# Patient Record
Sex: Female | Born: 1985 | Race: Asian | Hispanic: No | Marital: Married | Smoking: Never smoker
Health system: Southern US, Community
[De-identification: ages and names within clinical notes are randomized; demographics above are authoritative.]

## PROBLEM LIST (undated history)

## (undated) DIAGNOSIS — F329 Major depressive disorder, single episode, unspecified: Secondary | ICD-10-CM

## (undated) DIAGNOSIS — F419 Anxiety disorder, unspecified: Secondary | ICD-10-CM

## (undated) DIAGNOSIS — E039 Hypothyroidism, unspecified: Secondary | ICD-10-CM

## (undated) DIAGNOSIS — F32A Depression, unspecified: Secondary | ICD-10-CM

## (undated) HISTORY — DX: Depression, unspecified: F32.A

## (undated) HISTORY — PX: NO PAST SURGERIES: SHX2092

## (undated) HISTORY — DX: Hypothyroidism, unspecified: E03.9

## (undated) HISTORY — DX: Major depressive disorder, single episode, unspecified: F32.9

## (undated) HISTORY — DX: Anxiety disorder, unspecified: F41.9

---
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
## 2003-02-25 ENCOUNTER — Emergency Department (HOSPITAL_COMMUNITY): Admission: EM | Admit: 2003-02-25 | Discharge: 2003-02-25 | Payer: Self-pay | Admitting: *Deleted

## 2003-04-01 ENCOUNTER — Other Ambulatory Visit: Admission: RE | Admit: 2003-04-01 | Discharge: 2003-04-01 | Payer: Self-pay | Admitting: Gynecology

## 2004-05-04 ENCOUNTER — Other Ambulatory Visit: Admission: RE | Admit: 2004-05-04 | Discharge: 2004-05-04 | Payer: Self-pay | Admitting: Gynecology

## 2005-07-06 ENCOUNTER — Other Ambulatory Visit: Admission: RE | Admit: 2005-07-06 | Discharge: 2005-07-06 | Payer: Self-pay | Admitting: Gynecology

## 2006-07-12 ENCOUNTER — Other Ambulatory Visit: Admission: RE | Admit: 2006-07-12 | Discharge: 2006-07-12 | Payer: Self-pay | Admitting: Gynecology

## 2007-07-15 ENCOUNTER — Other Ambulatory Visit: Admission: RE | Admit: 2007-07-15 | Discharge: 2007-07-15 | Payer: Self-pay | Admitting: Gynecology

## 2008-07-15 ENCOUNTER — Ambulatory Visit: Payer: Self-pay | Admitting: Women's Health

## 2008-07-15 ENCOUNTER — Encounter: Payer: Self-pay | Admitting: Women's Health

## 2008-07-15 ENCOUNTER — Other Ambulatory Visit: Admission: RE | Admit: 2008-07-15 | Discharge: 2008-07-15 | Payer: Self-pay | Admitting: Gynecology

## 2009-07-22 ENCOUNTER — Ambulatory Visit: Payer: Self-pay | Admitting: Women's Health

## 2009-07-22 ENCOUNTER — Other Ambulatory Visit: Admission: RE | Admit: 2009-07-22 | Discharge: 2009-07-22 | Payer: Self-pay | Admitting: Gynecology

## 2010-03-22 ENCOUNTER — Ambulatory Visit (HOSPITAL_COMMUNITY)
Admission: RE | Admit: 2010-03-22 | Discharge: 2010-03-22 | Payer: Self-pay | Source: Home / Self Care | Attending: Obstetrics and Gynecology | Admitting: Obstetrics and Gynecology

## 2010-04-12 ENCOUNTER — Inpatient Hospital Stay (HOSPITAL_COMMUNITY)
Admission: AD | Admit: 2010-04-12 | Discharge: 2010-04-12 | Payer: Self-pay | Source: Home / Self Care | Attending: Obstetrics and Gynecology | Admitting: Obstetrics and Gynecology

## 2010-04-13 LAB — URINE MICROSCOPIC-ADD ON

## 2010-04-13 LAB — URINALYSIS, ROUTINE W REFLEX MICROSCOPIC
Bilirubin Urine: NEGATIVE
Hgb urine dipstick: NEGATIVE
Ketones, ur: >80 mg/dL — AB
Nitrite: NEGATIVE
Protein, ur: NEGATIVE mg/dL
Specific Gravity, Urine: 1.02 (ref 1.005–1.030)
Urine Glucose, Fasting: NEGATIVE mg/dL
Urobilinogen, UA: 0.2 mg/dL (ref 0.0–1.0)
pH: 6.5 (ref 5.0–8.0)

## 2010-04-13 LAB — FETAL FIBRONECTIN: Fetal Fibronectin: NEGATIVE

## 2010-05-17 ENCOUNTER — Inpatient Hospital Stay (HOSPITAL_COMMUNITY)
Admission: AD | Admit: 2010-05-17 | Discharge: 2010-05-19 | DRG: 775 | Disposition: A | Discharge status: Home / Self Care | Payer: PRIVATE HEALTH INSURANCE | Source: Ambulatory Visit | Attending: Obstetrics and Gynecology | Admitting: Obstetrics and Gynecology

## 2010-05-17 LAB — CBC
HCT: 34.5 % — ABNORMAL LOW (ref 36.0–46.0)
Hemoglobin: 12 g/dL (ref 12.0–15.0)
MCH: 29.1 pg (ref 26.0–34.0)
MCHC: 34.8 g/dL (ref 30.0–36.0)
MCV: 83.7 fL (ref 78.0–100.0)
Platelets: 217 10*3/uL (ref 150–400)
RBC: 4.12 MIL/uL (ref 3.87–5.11)
RDW: 14.3 % (ref 11.5–15.5)
WBC: 14.6 10*3/uL — ABNORMAL HIGH (ref 4.0–10.5)

## 2010-05-17 LAB — RPR: RPR Ser Ql: NONREACTIVE

## 2010-05-18 LAB — CBC
HCT: 26.6 % — ABNORMAL LOW (ref 36.0–46.0)
Hemoglobin: 9 g/dL — ABNORMAL LOW (ref 12.0–15.0)
MCH: 28.8 pg (ref 26.0–34.0)
MCHC: 33.8 g/dL (ref 30.0–36.0)
MCV: 85 fL (ref 78.0–100.0)
Platelets: 190 10*3/uL (ref 150–400)
RBC: 3.13 MIL/uL — ABNORMAL LOW (ref 3.87–5.11)
RDW: 14.6 % (ref 11.5–15.5)
WBC: 10.5 10*3/uL (ref 4.0–10.5)

## 2010-05-19 ENCOUNTER — Inpatient Hospital Stay (INDEPENDENT_AMBULATORY_CARE_PROVIDER_SITE_OTHER)
Admission: RE | Admit: 2010-05-19 | Discharge: 2010-05-19 | Disposition: A | Discharge status: Home / Self Care | Payer: PRIVATE HEALTH INSURANCE | Source: Ambulatory Visit

## 2010-05-19 DIAGNOSIS — T50995A Adverse effect of other drugs, medicaments and biological substances, initial encounter: Secondary | ICD-10-CM

## 2010-05-19 DIAGNOSIS — I10 Essential (primary) hypertension: Secondary | ICD-10-CM

## 2010-08-02 ENCOUNTER — Other Ambulatory Visit: Payer: Self-pay | Admitting: Obstetrics and Gynecology

## 2013-12-03 ENCOUNTER — Encounter: Payer: Self-pay | Admitting: Family Medicine

## 2013-12-03 ENCOUNTER — Ambulatory Visit (INDEPENDENT_AMBULATORY_CARE_PROVIDER_SITE_OTHER): Payer: 59 | Admitting: Family Medicine

## 2013-12-03 VITALS — BP 102/80 | HR 108 | Temp 98.2°F | Ht 65.25 in | Wt 230.0 lb

## 2013-12-03 DIAGNOSIS — F329 Major depressive disorder, single episode, unspecified: Secondary | ICD-10-CM

## 2013-12-03 DIAGNOSIS — F32A Depression, unspecified: Secondary | ICD-10-CM

## 2013-12-03 DIAGNOSIS — E669 Obesity, unspecified: Secondary | ICD-10-CM

## 2013-12-03 DIAGNOSIS — Z7689 Persons encountering health services in other specified circumstances: Secondary | ICD-10-CM

## 2013-12-03 DIAGNOSIS — E039 Hypothyroidism, unspecified: Secondary | ICD-10-CM

## 2013-12-03 DIAGNOSIS — F410 Panic disorder [episodic paroxysmal anxiety] without agoraphobia: Secondary | ICD-10-CM

## 2013-12-03 DIAGNOSIS — F3289 Other specified depressive episodes: Secondary | ICD-10-CM

## 2013-12-03 DIAGNOSIS — F411 Generalized anxiety disorder: Secondary | ICD-10-CM

## 2013-12-03 DIAGNOSIS — Z7189 Other specified counseling: Secondary | ICD-10-CM

## 2013-12-03 LAB — T4, FREE: Free T4: 0.72 ng/dL (ref 0.60–1.60)

## 2013-12-03 LAB — TSH: TSH: 2.87 u[IU]/mL (ref 0.35–4.50)

## 2013-12-03 MED ORDER — LORAZEPAM 0.5 MG PO TABS: 0.5000 mg | ORAL_TABLET | Freq: Three times a day (TID) | ORAL | Status: DC | PRN

## 2013-12-03 NOTE — Progress Notes (Signed)
Pre visit review using our clinic review tool, if applicable. No additional management support is needed unless otherwise documented below in the visit note. 

## 2013-12-03 NOTE — Progress Notes (Signed)
No chief complaint on file.   HPI:  Hannah Douglas is here to establish care. Was seeing a family doctor at [place] but insurance Last PCP and physical: seeing Dr. Tenny Craw at [facility] - UTD to day on regular physicals, last pap in 2013. Prefers to do physical here. Has paragard.  Has the following chronic problems and concerns today:  Patient Active Problem List   Diagnosis Date Noted  . GAD (generalized anxiety disorder) 12/03/2013  . Panic disorder 12/03/2013  . Unspecified hypothyroidism 12/03/2013  . 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 Had tried to stop in suddenly and did not do well. -has had a little worsening with stopping the medication, but really does not want to take it -doing yoga and meditation -has xanax - used it a few times in the last few months for panic Symptoms of GAD: Restlessness, on edge: yes Easily Fatigued: no Difficulty concentrating: a little Irritability: increased since weaning the medication Muscle tension:no Sleep Disturbance: yes Chronic, but she prefers not to use medications if possible - but manageable, functioning.  Is getting counseling every 2 week  URI: -started 2 days ago -symptoms: cough, nasal congestion -denies: fevers, SOB, sinus pain -doing tea - knows it is a cold  ROS negative for  unless reported above: fevers, unintentional weight loss, hearing or vision loss, chest pain, palpitations, struggling to breath, hemoptysis, melena, hematochezia, hematuria, falls, loc, si, thoughts of self harm  Past Medical History  Diagnosis Date  . Hypothyroidism   . Anxiety   . Depression     No family history on file.  History   Social History  . Marital Status: Married    Spouse Name: N/A    Number of Children: N/A  . Years of Education: N/A   Social History Main Topics  . Smoking status: Never Smoker   . Smokeless tobacco: None  . Alcohol Use: None  . Drug Use: None  . Sexual Activity: None   Other Topics Concern  . 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 For panic., Disp: 30 tablet, Rfl: 0  EXAM:  Filed Vitals:   12/03/13 1122  BP: 102/80  Pulse: 108  Temp: 98.2 F (36.8 C)    Body mass  index is 38 kg/(m^2).  GENERAL: vitals reviewed and listed above, alert, oriented, appears well hydrated and in no acute distress  HEENT: atraumatic, conjunttiva clear, no obvious abnormalities on inspection of external nose and ears, normal appearance of ear canals and TMs, clear nasal congestion, mild post oropharyngeal erythema with PND, no tonsillar edema or exudate, no sinus TTP  NECK: no obvious masses on inspection  LUNGS: clear to auscultation bilaterally, no wheezes, rales or rhonchi, good air movement  CV: HRRR, no peripheral edema  MS: moves all extremities  without noticeable abnormality  PSYCH: pleasant and cooperative, no obvious depression or anxiety  ASSESSMENT AND PLAN:  Discussed the following assessment and plan:  1. Obesity, unspecified -check TSH, lifestyle recs - increase exercise and healthy regular small meals  2. Unspecified hypothyroidism - TSH - T4, Free -discussed replacement if hypothyroid  3. GAD (generalized anxiety disorder) - for rare panic - DC xanax, use LORazepam (ATIVAN) 0.5 MG tablet; Take 1 tablet (0.5 mg total) by mouth every 8 (eight) hours as needed for anxiety. For panic.  Dispense: 30 tablet; Refill: 0 -risks discussed at length -risk and benefit of SSRI discussed, she prefers to wean off -counseling, medication, yoga, exercise -follow up if any worsening  4. Depression -stable, no depression at this time  5. Panic disorder -prn rare use of ativan  6. Encounter to establish care -We reviewed the PMH, PSH, FH, SH, Meds and Allergies. -We provided refills for any medications we will prescribe as needed. -We addressed current concerns per orders and patient instructions. -We have asked for records for pertinent exams, studies, vaccines and notes from previous providers. -We have advised patient to follow up per instructions below. -follow up for CPE and fasting labs in 3 months  -Patient advised to return or notify a doctor immediately if symptoms worsen or persist or new concerns arise.  Patient Instructions  -go to once per week on the lexapro for a few weeks then stop  -continue counseling, yoga, medication and increase cardiovascular exercise  -switch to ativan for rare panic attack       Kriste Basque R.

## 2013-12-03 NOTE — Patient Instructions (Signed)
-  go to once per week on the lexapro for a few weeks then stop  -continue counseling, yoga, medication and increase cardiovascular exercise  -switch to ativan for rare panic attack

## 2014-03-04 ENCOUNTER — Encounter: Payer: 59 | Admitting: Family Medicine

## 2014-03-05 ENCOUNTER — Ambulatory Visit (INDEPENDENT_AMBULATORY_CARE_PROVIDER_SITE_OTHER): Payer: 59 | Admitting: Family Medicine

## 2014-03-05 ENCOUNTER — Encounter: Payer: Self-pay | Admitting: Family Medicine

## 2014-03-05 VITALS — BP 110/74 | HR 73 | Temp 98.1°F | Ht 62.5 in | Wt 229.0 lb

## 2014-03-05 DIAGNOSIS — J069 Acute upper respiratory infection, unspecified: Secondary | ICD-10-CM

## 2014-03-05 DIAGNOSIS — H9209 Otalgia, unspecified ear: Secondary | ICD-10-CM

## 2014-03-05 MED ORDER — AMOXICILLIN 875 MG PO TABS: 875.0000 mg | ORAL_TABLET | Freq: Two times a day (BID) | ORAL | Status: DC

## 2014-03-05 NOTE — Progress Notes (Signed)
Pre visit review using our clinic review tool, if applicable. No additional management support is needed unless otherwise documented below in the visit note. 

## 2014-03-05 NOTE — Progress Notes (Signed)
HPI:  Ear pain and Upper Resp iInfection: -started: 2 weeks ago -symptoms:nasal congestion, sore throat, cough, sinus pressure, now with R ear pain on and off for about 1 week and pressure in this ear, a little sinus pain - better a little today -denies:fever, SOB, NVD, tooth pain -has tried: ibuprofen, tylenol -sick contacts/travel/risks: denies flu exposure, tick exposure or or Ebola risks, strep expsoure  ROS: See pertinent positives and negatives per HPI.  Past Medical History  Diagnosis Date  . Hypothyroidism   . Anxiety   . Depression     No past surgical history on file.  No family history on file.  History   Social History  . Marital Status: Married    Spouse Name: N/A    Number of Children: N/A  . Years of Education: N/A   Social History Main Topics  . Smoking status: Never Smoker   . Smokeless tobacco: None  . Alcohol Use: None  . Drug Use: None  . Sexual Activity: None   Other Topics Concern  . 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 For panic., Disp: 30 tablet, Rfl: 0;  PARAGARD INTRAUTERINE COPPER IU, by Intrauterine route., Disp: , Rfl: ;  amoxicillin (AMOXIL) 875 MG tablet, Take 1 tablet (875 mg total) by mouth 2 (two) times daily., Disp: 20 tablet, Rfl: 0  EXAM:  Filed Vitals:   03/05/14 1250  BP: 110/74  Pulse: 73  Temp: 98.1 F (36.7 C)    Body mass index is 41.19 kg/(m^2).  GENERAL: vitals reviewed and listed above, alert, oriented, appears well hydrated and in no acute distress  HEENT: atraumatic,  conjunttiva clear, no obvious abnormalities on inspection of external nose and ears, normal appearance of ear canals and TMs, a little pain with manipulation of tragus R, clear nasal congestion, mild post oropharyngeal erythema with PND, no tonsillar edema or exudate, no sinus TTP  NECK: no obvious masses on inspection  LUNGS: clear to auscultation bilaterally, no wheezes, rales or rhonchi, good air movement  CV: HRRR, no peripheral edema  MS: moves all extremities without noticeable abnormality  PSYCH: pleasant and cooperative, no obvious depression or anxiety  ASSESSMENT AND PLAN:  Discussed the following assessment and plan:  Ear pain, unspecified laterality - Plan: amoxicillin (AMOXIL) 875 MG tablet  Acute upper respiratory infection - Plan: amoxicillin (AMOXIL) 875 MG tablet  -given HPI and exam findings today, a serious infection or illness is unlikely. We discussed potential etiologies, with VURI being most likely possible developing sinusitis or otitis.We discussed treatment side effects, likely course, antibiotic misuse, transmission, and signs of developing a serious illness. -opted for delayed abx to start if not improving over ht enext few days or worsens. -of course, we advised to return or notify a doctor immediately if symptoms worsen or persist or new concerns arise.    There are no Patient Instructions on file for this visit.   Kriste BasqueKIM, Hannah Douglas R.

## 2014-05-05 ENCOUNTER — Encounter: Payer: Self-pay | Admitting: Family Medicine

## 2014-05-05 ENCOUNTER — Other Ambulatory Visit (HOSPITAL_COMMUNITY)
Admission: RE | Admit: 2014-05-05 | Discharge: 2014-05-05 | Disposition: A | Discharge status: Home / Self Care | Payer: 59 | Source: Ambulatory Visit | Attending: Family Medicine | Admitting: Family Medicine

## 2014-05-05 ENCOUNTER — Ambulatory Visit (INDEPENDENT_AMBULATORY_CARE_PROVIDER_SITE_OTHER): Payer: 59 | Admitting: Family Medicine

## 2014-05-05 VITALS — BP 110/80 | HR 84 | Temp 97.4°F | Ht 65.5 in | Wt 225.9 lb

## 2014-05-05 DIAGNOSIS — E669 Obesity, unspecified: Secondary | ICD-10-CM

## 2014-05-05 DIAGNOSIS — F411 Generalized anxiety disorder: Secondary | ICD-10-CM

## 2014-05-05 DIAGNOSIS — Z124 Encounter for screening for malignant neoplasm of cervix: Secondary | ICD-10-CM

## 2014-05-05 DIAGNOSIS — Z0000 Encounter for general adult medical examination without abnormal findings: Secondary | ICD-10-CM

## 2014-05-05 DIAGNOSIS — Z01419 Encounter for gynecological examination (general) (routine) without abnormal findings: Secondary | ICD-10-CM | POA: Diagnosis not present

## 2014-05-05 DIAGNOSIS — Z1151 Encounter for screening for human papillomavirus (HPV): Secondary | ICD-10-CM | POA: Insufficient documentation

## 2014-05-05 DIAGNOSIS — F410 Panic disorder [episodic paroxysmal anxiety] without agoraphobia: Secondary | ICD-10-CM

## 2014-05-05 LAB — LIPID PANEL
Cholesterol: 153 mg/dL (ref 0–200)
HDL: 37.5 mg/dL — ABNORMAL LOW (ref >39.00)
LDL Cholesterol: 80 mg/dL (ref 0–99)
NonHDL: 115.5
Total CHOL/HDL Ratio: 4
Triglycerides: 180 mg/dL — ABNORMAL HIGH (ref 0.0–149.0)
VLDL: 36 mg/dL (ref 0.0–40.0)

## 2014-05-05 LAB — HM PAP SMEAR: HM Pap smear: 2092016

## 2014-05-05 LAB — HEMOGLOBIN A1C: Hgb A1c MFr Bld: 5.1 % (ref 4.6–6.5)

## 2014-05-05 NOTE — Addendum Note (Signed)
Addended by: Johnella MoloneyFUNDERBURK, Saivon Prowse A on: 05/05/2014 10:04 AM   Modules accepted: Orders

## 2014-05-05 NOTE — Progress Notes (Signed)
HPI:  Here for CPE:  -Concerns and/or follow up today:   Hx mild hypothyroidism: -intermittently on synthroid in the past but self weaned -denies: excessive fatigue ,weight gain, skin changes, hot/cold intol, constipation  Hx GAD and mild panic: -on lexapro in the past -uses prn benzo rarely - had changed her from xanax to ativan -reports: seeing Dr. Evelene Croon in psychiatry - now back on xanax for panic attacks, back on lexapro  -denies: SI, thoughts of self, depression  -Diet: variety of foods, balance and well rounded  -Exercise: regular exercise - has really increased  -Taking folic acid, vitamin D or calcium: takes fish oil and multivit  -Diabetes and Dyslipidemia Screening:  -Hx of HTN: no  -Vaccines: flu vaccine - had this  -pap history: 2012 - reports all normal paps in the past  -FDLMP: 1 week ago; has paragard - reports inserted in 2012, no trouble, doing well; periods are normal, regula  -sexual activity: yes, female partner, no new partners  -wants STI testing: no  -Alcohol, Tobacco, drug use: see social history  Review of Systems - no fevers, unintentional weight loss, vision loss, hearing loss, chest pain, sob, hemoptysis, melena, hematochezia, hematuria, genital discharge, changing or concerning skin lesions, bleeding, bruising, loc, thoughts of self harm or SI  Past Medical History  Diagnosis Date  . Hypothyroidism   . Anxiety   . Depression     No past surgical history on file.  No family history on file.  History   Social History  . Marital Status: Married    Spouse Name: N/A    Number of Children: N/A  . Years of Education: N/A   Social History Main Topics  . Smoking status: Never Smoker   . Smokeless tobacco: None  . Alcohol Use: None  . Drug Use: None  . Sexual Activity: None   Other Topics Concern  . 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  escitalopram (LEXAPRO) 20 MG tablet, Take 20 mg by mouth daily., Disp: , Rfl:  .  PARAGARD INTRAUTERINE COPPER IU, by Intrauterine route., Disp: , Rfl:   EXAM:  Filed Vitals:   05/05/14 0941  BP: 110/80  Pulse: 84  Temp: 97.4 F (36.3 C)    GENERAL: vitals reviewed and listed below, alert, oriented, appears well hydrated and in no acute distress  HEENT: head atraumatic, PERRLA, normal appearance of eyes, ears, nose and mouth. moist mucus membranes.  NECK: supple, no masses or lymphadenopathy  LUNGS: clear to auscultation bilaterally, no rales, rhonchi or wheeze  CV: HRRR, no peripheral edema or cyanosis, normal pedal pulses  BREAST: normal appearance - no lesions or discharge, on palpation normal breast tissue without any suspicious masses  ABDOMEN: bowel sounds normal, soft, non tender to palpation, no masses, no rebound or guarding  GU: normal appearance of external genitalia - no lesions or masses, normal vaginal mucosa - no abnormal discharge, normal appearance of cervix - no lesions or abnormal discharge,  no masses or tenderness on palpation of uterus and ovaries. paragard strings seen from os  RECTAL: refused  SKIN: no rash or abnormal lesions  MS: normal gait, moves all extremities normally  NEURO: CN II-XII grossly intact, normal muscle strength and sensation to light touch on extremities  PSYCH: normal affect, pleasant and cooperative  ASSESSMENT AND PLAN:  Discussed the following assessment and plan:  Visit for preventive health examination - Plan: Lipid Panel, Hemoglobin A1c  Obesity - Plan: Lipid Panel, Hemoglobin A1c  GAD (generalized anxiety disorder)  Panic  disorder  -Discussed and advised all [place] preventive services health task force level A and B recommendations for age, sex and risks.  -Advised at least 150 minutes of exercise per week and a healthy diet low in saturated fats and sweets and consisting of fresh fruits and vegetables, lean meats such as fish and white chicken and whole grains.  -labs, studies and vaccines per orders this encounter  Orders Placed This Encounter  Procedures  . Lipid Panel  . Hemoglobin A1c    Patient advised to return to clinic immediately if symptoms worsen or persist or new concerns.  Patient Instructions  BEFORE YOU LEAVE: -labs -follow up in 6 months  -VD3 [phone] IU daily and adequate calcium (1200mg  ) in diet  -We have ordered labs or studies at this visit. It can take up to 1-2 weeks for results and processing. We will contact you with instructions IF your results are abnormal. Normal results will be released to your [facility]. If you have not heard from us or can not find your results in [facility] in 2 weeks please contact our office.  -PLEASE SIGN UP FOR MYCHART TODAY   We recommend the following healthy lifestyle measures: - eat a healthy diet consisting of lots of vegetables, fruits, beans, nuts, seeds, healthy meats such as white chicken and fish and whole grains.  - avoid fried foods, fast food, processed foods, sodas, red meet and other fattening foods.  - get a least 150 minutes of aerobic exercise per week.       No Follow-up on file.  Kriste BasqueKIM, HANNAH R.

## 2014-05-05 NOTE — Progress Notes (Signed)
Pre visit review using our clinic review tool, if applicable. No additional management support is needed unless otherwise documented below in the visit note. 

## 2014-05-05 NOTE — Patient Instructions (Signed)
BEFORE YOU LEAVE: -labs -follow up in 6 months  -VD3 [phone] IU daily and adequate calcium (1200mg  ) in diet  -We have ordered labs or studies at this visit. It can take up to 1-2 weeks for results and processing. We will contact you with instructions IF your results are abnormal. Normal results will be released to your [facility]. If you have not heard from us or can not find your results in [facility] in 2 weeks please contact our office.  -PLEASE SIGN UP FOR MYCHART TODAY   We recommend the following healthy lifestyle measures: - eat a healthy diet consisting of lots of vegetables, fruits, beans, nuts, seeds, healthy meats such as white chicken and fish and whole grains.  - avoid fried foods, fast food, processed foods, sodas, red meet and other fattening foods.  - get a least 150 minutes of aerobic exercise per week.

## 2014-05-07 LAB — CYTOLOGY - PAP

## 2015-01-21 ENCOUNTER — Telehealth: Payer: Self-pay | Admitting: Family Medicine

## 2015-01-21 NOTE — Telephone Encounter (Signed)
I called the pt and informed her of the message below and scheduled an appt for tomorrow at 10:30am.

## 2015-01-21 NOTE — Telephone Encounter (Signed)
Left a message for the pt to return my call.  

## 2015-01-21 NOTE — Telephone Encounter (Signed)
Happy to refill if can see her to discuss as have not seen her in while. Can you help her get appt today or tomorrow? Thanks.

## 2015-01-21 NOTE — Telephone Encounter (Signed)
[facility] Dr [facility]velene CroonKaur. Pt states it will cost her a lot to see him. Pt was going to get her 6 mo meds refilled this week when she fopund out this information.  escitalopram (LEXAPRO) 20 MG tablet  1/day wellbutrin 300 XR 1/day  Pt wants to know if Dr Selena BattenKim will refill until she finds a psychiatrist Pt has 1 (one) each left  [facility]

## 2015-01-22 ENCOUNTER — Encounter: Payer: Self-pay | Admitting: Family Medicine

## 2015-01-22 ENCOUNTER — Ambulatory Visit (INDEPENDENT_AMBULATORY_CARE_PROVIDER_SITE_OTHER): Payer: 59 | Admitting: Family Medicine

## 2015-01-22 VITALS — BP 108/80 | HR 94 | Temp 98.5°F | Ht 65.5 in | Wt 228.1 lb

## 2015-01-22 DIAGNOSIS — E669 Obesity, unspecified: Secondary | ICD-10-CM

## 2015-01-22 DIAGNOSIS — F410 Panic disorder [episodic paroxysmal anxiety] without agoraphobia: Secondary | ICD-10-CM

## 2015-01-22 DIAGNOSIS — F3342 Major depressive disorder, recurrent, in full remission: Secondary | ICD-10-CM

## 2015-01-22 DIAGNOSIS — E039 Hypothyroidism, unspecified: Secondary | ICD-10-CM | POA: Diagnosis not present

## 2015-01-22 DIAGNOSIS — F411 Generalized anxiety disorder: Secondary | ICD-10-CM

## 2015-01-22 LAB — TSH: TSH: 4.37 u[IU]/mL (ref 0.35–4.50)

## 2015-01-22 MED ORDER — BUPROPION HCL ER (XL) 300 MG PO TB24: 300.0000 mg | ORAL_TABLET | Freq: Every day | ORAL | Status: DC

## 2015-01-22 MED ORDER — ESCITALOPRAM OXALATE 20 MG PO TABS: 20.0000 mg | ORAL_TABLET | Freq: Every day | ORAL | Status: DC

## 2015-01-22 NOTE — Progress Notes (Signed)
Pre visit review using our clinic review tool, if applicable. No additional management support is needed unless otherwise documented below in the visit note. 

## 2015-01-22 NOTE — Progress Notes (Signed)
HPI:  Hx mild hypothyroidism: -intermittently on synthroid in the past but self weaned -denies: excessive fatigue ,weight gain, skin changes, hot/cold intol, constipation  Hx MDD mild, GAD and mild panic: -used to see Dr. Evelene CroonKaur and was on benozs and lexapro -reports Dr. Evelene CroonKaur no longer in network -current meds: lexapro 20 and wellbutrin XR 300, xanax 0.5mg  rarely (1-2x per month) for panic -reports doing much better -regular CBT with Dyke MaesKen Brown -reports regular exercise -denies: SI, thoughts of self, depression, frequent anxiety or panic -has paragard  ROS: See pertinent positives and negatives per HPI.  Past Medical History  Diagnosis Date  . Hypothyroidism   . Anxiety   . Depression     No past surgical history on file.  No family history on file.  Social History   Social History  . Marital Status: Married    Spouse Name: N/A  . Number of Children: N/A  . Years of Education: N/A   Social History Main Topics  . Smoking status: Never Smoker   . Smokeless tobacco: None  . Alcohol Use: None  . Drug Use: None  . Sexual Activity: Not Asked   Other Topics Concern  . 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  buPROPion (WELLBUTRIN XL) 300 MG 24 hr tablet, Take 1 tablet (300 mg total) by mouth daily., Disp: 30 tablet, Rfl: 3 .  escitalopram (LEXAPRO) 20 MG tablet, Take 1 tablet (20 mg total) by mouth daily., Disp: 30 tablet, Rfl: 3 .  PARAGARD INTRAUTERINE COPPER IU, by Intrauterine route., Disp: , Rfl:  .  ALPRAZolam (XANAX) 0.5 MG tablet, Take 1 tablet (0.5 mg total) by mouth 2 (two) times daily  as needed for anxiety (panic attack - uses 1-2 times per month)., Disp: , Rfl: 0  EXAM:  Filed Vitals:   01/22/15 1034  BP: 108/80  Pulse: 94  Temp: 98.5 F (36.9 C)    Body mass index is 37.37 kg/(m^2).  GENERAL: vitals reviewed and listed above, alert, oriented, appears well hydrated and in no acute distress  HEENT: atraumatic, conjunttiva clear, no obvious abnormalities on inspection of external nose and ears  NECK: no obvious masses on inspection  LUNGS: clear to auscultation bilaterally, no wheezes, rales or rhonchi, good air movement  CV: HRRR, no peripheral edema  MS: moves all extremities without noticeable abnormality  PSYCH: pleasant and cooperative, no obvious depression or anxiety  ASSESSMENT AND PLAN:  Discussed the following assessment and plan:  Recurrent major depressive disorder, in full remission (HCC)  GAD (generalized anxiety disorder)  Panic disorder  Hypothyroidism, unspecified hypothyroidism type - Plan: TSH  Obesity, unspecified  -doing well, meds refilled and she prefers to see me moving forward for this -she plans to cont CBT with Dyke MaesKen Brown -lifestyle recs -TSH check -follow up 3 months -Patient advised to return or notify a doctor immediately if symptoms worsen or persist or new concerns arise.  Patient Instructions  BEFORE YOU LEAVE: -labs -follow up  appointment in 3 months  We recommend the following healthy lifestyle measures: - eat a healthy whole foods diet consisting of regular small meals composed of vegetables, fruits, beans, nuts, seeds, healthy meats such as white chicken and fish and whole grains.  - avoid sweets, white starchy foods, fried foods, fast food, processed foods, sodas, red meet and other fattening foods.  - get a least 150-300 minutes of aerobic exercise per week.   -We have ordered labs or studies at this visit. It can take up to 1-2 weeks for results and processing. We will contact you with instructions IF  your results are abnormal. Normal results will be released to your [facility] If you have not heard from [place] or can not find your results in [facility] in 2 weeks please contact our office.            Kriste Basque R.

## 2015-01-22 NOTE — Patient Instructions (Signed)
BEFORE YOU LEAVE: -labs -follow up appointment in 3 months  We recommend the following healthy lifestyle measures: - eat a healthy whole foods diet consisting of regular small meals composed of vegetables, fruits, beans, nuts, seeds, healthy meats such as white chicken and fish and whole grains.  - avoid sweets, white starchy foods, fried foods, fast food, processed foods, sodas, red meet and other fattening foods.  - get a least 150-300 minutes of aerobic exercise per week.   -We have ordered labs or studies at this visit. It can take up to 1-2 weeks for results and processing. We will contact you with instructions IF your results are abnormal. Normal results will be released to your [facility]. If you have not heard from us or can not find your results in [facility] in 2 weeks please contact our office.

## 2015-03-30 DIAGNOSIS — H52223 Regular astigmatism, bilateral: Secondary | ICD-10-CM | POA: Diagnosis not present

## 2015-04-02 MED FILL — ESCITALOPRAM 20 MG TABLET: 20 | 30 days supply | Qty: 30 | Fill #1

## 2015-04-02 MED FILL — BUPROPION HCL XL 300 MG TAB: 300 | 30 days supply | Qty: 30 | Fill #1

## 2015-04-09 MED FILL — ALPRAZolam ER 1 MG TB24: 1 | 90 days supply | Qty: 90 | Fill #0

## 2015-04-09 MED FILL — ALPRAZolam 0.5 MG TABS: 0.5 | 90 days supply | Qty: 180 | Fill #0

## 2015-04-20 ENCOUNTER — Ambulatory Visit (INDEPENDENT_AMBULATORY_CARE_PROVIDER_SITE_OTHER): Payer: Self-pay | Admitting: Family Medicine

## 2015-04-20 DIAGNOSIS — R69 Illness, unspecified: Secondary | ICD-10-CM

## 2015-04-20 NOTE — Progress Notes (Signed)
NO SHOW  On [facility] prior to appt:  Hx mild hypothyroidism: -intermittently on synthroid in the past but self weaned   Hx MDD mild, GAD and mild panic: -used to see Dr. Evelene Croon and was on benozs and lexapro -reported Dr. Evelene Croon no longer in network -current meds: lexapro 20 and wellbutrin XR 300, xanax 0.5mg  rarely (1-2x per month) for panic -regular CBT with Dyke Maes -has paragard

## 2015-04-29 MED FILL — ESCITALOPRAM 20 MG TABLET: 20 | 30 days supply | Qty: 30 | Fill #2

## 2015-05-17 MED FILL — ESCITALOPRAM 20 MG TABLET: 20 | 90 days supply | Qty: 180 | Fill #0

## 2015-05-28 MED FILL — BUPROPION HCL XL 300 MG TAB: 300 | 30 days supply | Qty: 30 | Fill #2

## 2015-06-23 ENCOUNTER — Telehealth: Payer: 59 | Admitting: Family

## 2015-06-23 DIAGNOSIS — N760 Acute vaginitis: Secondary | ICD-10-CM

## 2015-06-23 DIAGNOSIS — A499 Bacterial infection, unspecified: Secondary | ICD-10-CM | POA: Diagnosis not present

## 2015-06-23 DIAGNOSIS — B9689 Other specified bacterial agents as the cause of diseases classified elsewhere: Secondary | ICD-10-CM

## 2015-06-23 MED ORDER — METRONIDAZOLE 500 MG PO TABS: 500.0000 mg | ORAL_TABLET | Freq: Two times a day (BID) | ORAL | Status: DC

## 2015-06-23 NOTE — Progress Notes (Signed)

## 2015-07-07 MED FILL — BUPROPION HCL XL 300 MG TAB: 300 | 90 days supply | Qty: 90 | Fill #0

## 2015-09-14 MED FILL — ESCITALOPRAM 20 MG TABLET: 20 | 90 days supply | Qty: 180 | Fill #1

## 2015-10-07 ENCOUNTER — Encounter: Payer: Self-pay | Admitting: Family Medicine

## 2015-10-07 ENCOUNTER — Ambulatory Visit (INDEPENDENT_AMBULATORY_CARE_PROVIDER_SITE_OTHER): Payer: 59 | Admitting: Family Medicine

## 2015-10-07 VITALS — BP 120/88 | HR 90 | Temp 98.4°F | Ht 65.5 in | Wt 230.4 lb

## 2015-10-07 DIAGNOSIS — J069 Acute upper respiratory infection, unspecified: Secondary | ICD-10-CM | POA: Diagnosis not present

## 2015-10-07 DIAGNOSIS — J029 Acute pharyngitis, unspecified: Secondary | ICD-10-CM | POA: Diagnosis not present

## 2015-10-07 LAB — POCT RAPID STREP A (OFFICE): Rapid Strep A Screen: NEGATIVE

## 2015-10-07 NOTE — Addendum Note (Signed)
Addended by: Johnella MoloneyFUNDERBURK, Karrah Mangini A on: 10/07/2015 02:44 PM   Modules accepted: Orders

## 2015-10-07 NOTE — Progress Notes (Signed)
HPI:  Hannah Douglas is here for an acute visit for a sore throat. She reports she has had a cold that started last week with drainage, hacking cough and congestion. The sore throat has been more the last few days. She is around a lot of kids with her work. Denies fevers, shortness of breath, wheezing, nausea, vomiting, diarrhea, headache, sinus pain. Denies strep or flu exposure that she knows of.  ROS: See pertinent positives and negatives per HPI.  Past Medical History  Diagnosis Date  . Hypothyroidism   . Anxiety   . Depression     No past surgical history on file.  No family history on file.  Social History   Social History  . Marital Status: Married    Spouse Name: N/A  . Number of Children: N/A  . Years of Education: N/A   Social History Main Topics  . Smoking status: Never Smoker   . Smokeless tobacco: None  . Alcohol Use: None  . Drug Use: None  . Sexual Activity: Not Asked   Other Topics Concern  . 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  buPROPion (WELLBUTRIN XL) 300 MG 24 hr tablet, Take 1 tablet (300 mg total) by mouth daily., Disp: 30 tablet, Rfl: 3 .  escitalopram (LEXAPRO) 20 MG tablet, Take 1 tablet (20 mg total) by mouth daily., Disp: 30 tablet, Rfl: 3 .  PARAGARD INTRAUTERINE COPPER IU, by Intrauterine route., Disp: , Rfl:   EXAM:  Filed Vitals:   10/07/15 1358  BP: 120/88  Pulse: 90  Temp: 98.4 F (36.9 C)    Body mass index is 37.74 kg/(m^2).  GENERAL: vitals reviewed and listed above, alert, oriented, appears well hydrated and in no acute distress  HEENT: atraumatic,  conjunttiva clear, no obvious abnormalities on inspection of external nose and ears, normal appearance of ear canals and TMs, clear nasal congestion, mild post oropharyngeal erythema with PND, 1+ tonsillar edema, no exudate, no sinus TTP  NECK: no obvious masses on inspection  LUNGS: clear to auscultation bilaterally, no wheezes, rales or rhonchi, good air movement  CV: HRRR, no peripheral edema  MS: moves all extremities without noticeable abnormality  PSYCH: pleasant and cooperative, no obvious depression or anxiety  ASSESSMENT AND PLAN:  Discussed the following assessment and plan:  Sore throat  Viral upper respiratory illness  - rapid strep and culture per her concerns -Most likely viral, opted for supportive measures and return to clinic if symptoms worsen or persist -Patient advised to return or notify a doctor immediately if symptoms worsen or persist or new concerns arise.  Patient Instructions  INSTRUCTIONS FOR UPPER RESPIRATORY INFECTION:  -plenty of rest and fluids  -nasal saline wash 2-3 times daily (use prepackaged nasal saline or bottled/distilled water if making your own)   -can use AFRIN nasal spray for drainage and nasal congestion - but do NOT use longer then 3-4 days  -can use tylenol (in no history of liver disease) or ibuprofen (  if no history of kidney disease, bowel bleeding or significant heart disease) as directed for aches and sorethroat  -in the winter time, using a humidifier at night is helpful (please follow cleaning instructions)  -if you are taking a cough medication - use only as directed, may also try a teaspoon of honey to coat the throat and throat lozenges.  -for sore throat, salt water gargles can help  -follow up if you have fevers, facial pain, tooth pain, difficulty breathing or are worsening or symptoms persist longer then expected  Upper Respiratory Infection, Adult An upper respiratory infection (URI) is also known as the common  cold. It is often caused by a type of germ (virus). Colds are easily spread (contagious). You can pass it to others by kissing, coughing, sneezing, or drinking out of the same glass. Usually, you get better in 1 to 3  weeks.  However, the cough can last for even longer. HOME CARE   Only take medicine as told by your doctor. Follow instructions provided above.  Drink enough water and fluids to keep your pee (urine) clear or pale yellow.  Get plenty of rest.  Return to work when your temperature is < 100 for 24 hours or as told by your doctor. You may use a face mask and wash your hands to stop your cold from spreading. GET HELP RIGHT AWAY IF:   After the first few days, you feel you are getting worse.  You have questions about your medicine.  You have chills, shortness of breath, or red spit (mucus).  You have pain in the face for more then 1-2 days, especially when you bend forward.  You have a fever, puffy (swollen) neck, pain when you swallow, or white spots in the back of your throat.  You have a bad headache, ear pain, sinus pain, or chest pain.  You have a high-pitched whistling sound when you breathe in and out (wheezing).  You cough up blood.  You have sore muscles or a stiff neck. MAKE SURE YOU:   Understand these instructions.  Will watch your condition.  Will get help right away if you are not doing well or get worse. Document Released: 08/30/2007 Document Revised: 06/05/2011 Document Reviewed: 06/18/2013 [facility] Patient Information 2015 [place], [place]. This information is not intended to replace advice given to you by your health care provider. Make sure you discuss any questions you have with your health care provider.     Kriste Basque R., DO

## 2015-10-07 NOTE — Progress Notes (Signed)
Pre visit review using our clinic review tool, if applicable. No additional management support is needed unless otherwise documented below in the visit note. 

## 2015-10-07 NOTE — Patient Instructions (Signed)
INSTRUCTIONS FOR UPPER RESPIRATORY INFECTION:  -plenty of rest and fluids  -nasal saline wash 2-3 times daily (use prepackaged nasal saline or bottled/distilled water if making your own)   -can use AFRIN nasal spray for drainage and nasal congestion - but do NOT use longer then 3-4 days  -can use tylenol (in no history of liver disease) or ibuprofen (if no history of kidney disease, bowel bleeding or significant heart disease) as directed for aches and sorethroat  -in the winter time, using a humidifier at night is helpful (please follow cleaning instructions)  -if you are taking a cough medication - use only as directed, may also try a teaspoon of honey to coat the throat and throat lozenges.  -for sore throat, salt water gargles can help  -follow up if you have fevers, facial pain, tooth pain, difficulty breathing or are worsening or symptoms persist longer then expected  Upper Respiratory Infection, Adult An upper respiratory infection (URI) is also known as the common cold. It is often caused by a type of germ (virus). Colds are easily spread (contagious). You can pass it to others by kissing, coughing, sneezing, or drinking out of the same glass. Usually, you get better in 1 to 3  weeks.  However, the cough can last for even longer. HOME CARE   Only take medicine as told by your doctor. Follow instructions provided above.  Drink enough water and fluids to keep your pee (urine) clear or pale yellow.  Get plenty of rest.  Return to work when your temperature is < 100 for 24 hours or as told by your doctor. You may use a face mask and wash your hands to stop your cold from spreading. GET HELP RIGHT AWAY IF:   After the first few days, you feel you are getting worse.  You have questions about your medicine.  You have chills, shortness of breath, or red spit (mucus).  You have pain in the face for more then 1-2 days, especially when you bend forward.  You have a fever, puffy  (swollen) neck, pain when you swallow, or white spots in the back of your throat.  You have a bad headache, ear pain, sinus pain, or chest pain.  You have a high-pitched whistling sound when you breathe in and out (wheezing).  You cough up blood.  You have sore muscles or a stiff neck. MAKE SURE YOU:   Understand these instructions.  Will watch your condition.  Will get help right away if you are not doing well or get worse. Document Released: 08/30/2007 Document Revised: 06/05/2011 Document Reviewed: 06/18/2013 [employer] Patient Information 2015 [employer]. This information is not intended to replace advice given to you by your health care provider. Make sure you discuss any questions you have with your health care provider.  

## 2015-10-09 LAB — CULTURE, GROUP A STREP: Organism ID, Bacteria: NORMAL

## 2015-10-14 MED FILL — FLUoxetine HCL 20 MG CAPS: 20 | 30 days supply | Qty: 90 | Fill #0

## 2015-10-26 ENCOUNTER — Other Ambulatory Visit: Payer: Self-pay | Admitting: Obstetrics and Gynecology

## 2015-10-26 DIAGNOSIS — Z30432 Encounter for removal of intrauterine contraceptive device: Secondary | ICD-10-CM | POA: Diagnosis not present

## 2015-10-26 DIAGNOSIS — Z01419 Encounter for gynecological examination (general) (routine) without abnormal findings: Secondary | ICD-10-CM | POA: Diagnosis not present

## 2015-10-26 DIAGNOSIS — Z6836 Body mass index (BMI) 36.0-36.9, adult: Secondary | ICD-10-CM | POA: Diagnosis not present

## 2015-10-26 DIAGNOSIS — Z124 Encounter for screening for malignant neoplasm of cervix: Secondary | ICD-10-CM | POA: Diagnosis not present

## 2015-10-26 LAB — HM PAP SMEAR: HM Pap smear: NEGATIVE

## 2015-10-28 ENCOUNTER — Encounter: Payer: Self-pay | Admitting: Family Medicine

## 2015-10-28 LAB — CYTOLOGY - PAP

## 2015-11-09 ENCOUNTER — Encounter: Payer: 59 | Admitting: Family Medicine

## 2015-11-11 MED FILL — BUPROPION HCL XL 300 MG TAB: 300 | 90 days supply | Qty: 90 | Fill #1

## 2015-12-10 MED FILL — FLUoxetine HCL 20 MG CAPS: 20 | 30 days supply | Qty: 90 | Fill #1

## 2016-01-11 ENCOUNTER — Other Ambulatory Visit: Payer: Self-pay | Admitting: Obstetrics and Gynecology

## 2016-01-11 DIAGNOSIS — Z113 Encounter for screening for infections with a predominantly sexual mode of transmission: Secondary | ICD-10-CM | POA: Diagnosis not present

## 2016-01-11 DIAGNOSIS — Z3491 Encounter for supervision of normal pregnancy, unspecified, first trimester: Secondary | ICD-10-CM | POA: Diagnosis not present

## 2016-01-11 DIAGNOSIS — Z3201 Encounter for pregnancy test, result positive: Secondary | ICD-10-CM | POA: Diagnosis not present

## 2016-01-11 DIAGNOSIS — Z01419 Encounter for gynecological examination (general) (routine) without abnormal findings: Secondary | ICD-10-CM | POA: Diagnosis not present

## 2016-01-11 DIAGNOSIS — N925 Other specified irregular menstruation: Secondary | ICD-10-CM | POA: Diagnosis not present

## 2016-01-20 MED FILL — FLUoxetine HCL 20 MG CAPS: 20 | 30 days supply | Qty: 90 | Fill #2

## 2016-02-03 ENCOUNTER — Encounter: Payer: Self-pay | Admitting: Family Medicine

## 2016-02-03 MED FILL — DICLEGIS DR 10-10 MG TABLET: 10-10 | 30 days supply | Qty: 60 | Fill #0

## 2016-02-10 DIAGNOSIS — Z1371 Encounter for nonprocreative screening for genetic disease carrier status: Secondary | ICD-10-CM | POA: Diagnosis not present

## 2016-02-10 DIAGNOSIS — Z3682 Encounter for antenatal screening for nuchal translucency: Secondary | ICD-10-CM | POA: Diagnosis not present

## 2016-02-10 DIAGNOSIS — Z3480 Encounter for supervision of other normal pregnancy, unspecified trimester: Secondary | ICD-10-CM | POA: Diagnosis not present

## 2016-02-10 LAB — OB RESULTS CONSOLE ABO/RH: RH Type: POSITIVE

## 2016-02-10 LAB — OB RESULTS CONSOLE GC/CHLAMYDIA
Chlamydia: NEGATIVE
Gonorrhea: NEGATIVE

## 2016-02-10 LAB — OB RESULTS CONSOLE RPR: RPR: NONREACTIVE

## 2016-02-10 LAB — OB RESULTS CONSOLE RUBELLA ANTIBODY, IGM: Rubella: IMMUNE

## 2016-02-10 LAB — OB RESULTS CONSOLE HIV ANTIBODY (ROUTINE TESTING): HIV: NONREACTIVE

## 2016-02-10 LAB — OB RESULTS CONSOLE ANTIBODY SCREEN: Antibody Screen: NEGATIVE

## 2016-02-10 LAB — OB RESULTS CONSOLE HEPATITIS B SURFACE ANTIGEN: Hepatitis B Surface Ag: NEGATIVE

## 2016-03-10 DIAGNOSIS — Z3480 Encounter for supervision of other normal pregnancy, unspecified trimester: Secondary | ICD-10-CM | POA: Diagnosis not present

## 2016-03-13 MED FILL — MAKENA 1,250 MG/5 ML VIAL: 250 | 30 days supply | Qty: 5 | Fill #0

## 2016-03-17 DIAGNOSIS — Z8751 Personal history of pre-term labor: Secondary | ICD-10-CM | POA: Diagnosis not present

## 2016-03-23 DIAGNOSIS — Z369 Encounter for antenatal screening, unspecified: Secondary | ICD-10-CM | POA: Diagnosis not present

## 2016-03-23 DIAGNOSIS — Z8751 Personal history of pre-term labor: Secondary | ICD-10-CM | POA: Diagnosis not present

## 2016-03-27 NOTE — L&D Delivery Note (Addendum)
Patient was C/C/+3 and pushed for 5 minutes without epidural.   NSVD  female infant, Apgars 8,9, weight P.   The patient had no lacerations. Fundus was firm. EBL was expected amount. Placenta was delivered intact. Vagina was clear.  Baby was vigorous and doing skin to skin with mother.  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  Pt has met with peds ortho in Jan.  Hannah Douglas A

## 2016-03-30 DIAGNOSIS — Z8751 Personal history of pre-term labor: Secondary | ICD-10-CM | POA: Diagnosis not present

## 2016-04-03 ENCOUNTER — Encounter: Payer: Self-pay | Admitting: Family Medicine

## 2016-04-04 MED FILL — MAKENA 1,250 MG/5 ML VIAL: 250 | 30 days supply | Qty: 5 | Fill #1

## 2016-04-07 DIAGNOSIS — Z369 Encounter for antenatal screening, unspecified: Secondary | ICD-10-CM | POA: Diagnosis not present

## 2016-04-07 DIAGNOSIS — Z8751 Personal history of pre-term labor: Secondary | ICD-10-CM | POA: Diagnosis not present

## 2016-04-07 DIAGNOSIS — Z363 Encounter for antenatal screening for malformations: Secondary | ICD-10-CM | POA: Diagnosis not present

## 2016-04-10 ENCOUNTER — Other Ambulatory Visit (HOSPITAL_COMMUNITY): Payer: Self-pay | Admitting: Obstetrics and Gynecology

## 2016-04-10 DIAGNOSIS — Q6689 Other  specified congenital deformities of feet: Secondary | ICD-10-CM

## 2016-04-10 DIAGNOSIS — Q6601 Congenital talipes equinovarus, right foot: Secondary | ICD-10-CM

## 2016-04-10 DIAGNOSIS — Z3689 Encounter for other specified antenatal screening: Secondary | ICD-10-CM

## 2016-04-10 DIAGNOSIS — O359XX9 Maternal care for (suspected) fetal abnormality and damage, unspecified, other fetus: Secondary | ICD-10-CM

## 2016-04-10 DIAGNOSIS — Q6602 Congenital talipes equinovarus, left foot: Secondary | ICD-10-CM

## 2016-04-13 MED FILL — DICLEGIS DR 10-10 MG TABLET: 10-10 | 30 days supply | Qty: 120 | Fill #0

## 2016-04-14 ENCOUNTER — Ambulatory Visit (HOSPITAL_COMMUNITY)
Admission: RE | Admit: 2016-04-14 | Discharge: 2016-04-14 | Disposition: A | Discharge status: Home / Self Care | Payer: 59 | Source: Ambulatory Visit | Attending: Obstetrics and Gynecology | Admitting: Obstetrics and Gynecology

## 2016-04-14 ENCOUNTER — Other Ambulatory Visit (HOSPITAL_COMMUNITY): Payer: Self-pay | Admitting: Obstetrics and Gynecology

## 2016-04-14 ENCOUNTER — Encounter (HOSPITAL_COMMUNITY): Payer: Self-pay | Admitting: *Deleted

## 2016-04-14 DIAGNOSIS — O283 Abnormal ultrasonic finding on antenatal screening of mother: Secondary | ICD-10-CM | POA: Diagnosis not present

## 2016-04-14 DIAGNOSIS — O358XX0 Maternal care for other (suspected) fetal abnormality and damage, not applicable or unspecified: Secondary | ICD-10-CM | POA: Diagnosis not present

## 2016-04-14 DIAGNOSIS — O99210 Obesity complicating pregnancy, unspecified trimester: Secondary | ICD-10-CM

## 2016-04-14 DIAGNOSIS — O99212 Obesity complicating pregnancy, second trimester: Secondary | ICD-10-CM | POA: Diagnosis not present

## 2016-04-14 DIAGNOSIS — Z3689 Encounter for other specified antenatal screening: Secondary | ICD-10-CM

## 2016-04-14 DIAGNOSIS — Q660 Congenital talipes equinovarus: Secondary | ICD-10-CM | POA: Insufficient documentation

## 2016-04-14 DIAGNOSIS — Z8751 Personal history of pre-term labor: Secondary | ICD-10-CM | POA: Diagnosis not present

## 2016-04-14 DIAGNOSIS — Z3A20 20 weeks gestation of pregnancy: Secondary | ICD-10-CM | POA: Diagnosis not present

## 2016-04-14 DIAGNOSIS — O35HXX0 Maternal care for other (suspected) fetal abnormality and damage, fetal lower extremities anomalies, not applicable or unspecified: Secondary | ICD-10-CM

## 2016-04-14 DIAGNOSIS — Z363 Encounter for antenatal screening for malformations: Secondary | ICD-10-CM | POA: Diagnosis not present

## 2016-04-14 DIAGNOSIS — O09212 Supervision of pregnancy with history of pre-term labor, second trimester: Secondary | ICD-10-CM

## 2016-04-14 DIAGNOSIS — O359XX9 Maternal care for (suspected) fetal abnormality and damage, unspecified, other fetus: Secondary | ICD-10-CM | POA: Diagnosis not present

## 2016-04-17 NOTE — Progress Notes (Signed)
Genetic Counseling  High-Risk Gestation Note  Appointment Date:  04/14/2016 Referred By: Allyn Kenner, DO Date of Birth:  12/05/1985 Partner:  Hannah Douglas   Pregnancy History: [id number] Estimated Date of Delivery: 08/28/16 Estimated Gestational Age: 36w4dAttending: MRenella Cunas MD   I met with Mrs. Hannah MARCHANTand her husband, [name] for genetic counseling because of the ultrasound finding of fetal club foot.   In summary:  Discussed ultrasound findings in detail  Bilateral fetal club foot visualized  Remaining visualized fetal anatomy within normal limits  Reviewed previous screening  NIPS - within normal limits (facilitated through Hannah Douglas)  Reviewed options for additional screening  Ongoing ultrasound  Expanded carrier screening panel - [facility]  Reviewed options for diagnostic testing, including risks, benefits, limitations and alternatives- declined amniocentesis  Reviewed other explanations for ultrasound findings  Offered prenatal consultation with pediatric orthopedics- [facility]  Reviewed family history concerns  We began by reviewing the ultrasound in detail. Ultrasound today also visualized bilateral club feet. Remaining fetal anatomy was visualized and was within normal limits. Complete ultrasound report under separate cover.   We discussed that clubfoot/feet is a term that actually describes three distinct anomalies (talipes equinovarus, talipes calcaneovalgus, and metatarsus varus) and occurs in 1 in 1000 births. The most common type of clubfeet, talipes equinovarus, is characterized by forefoot adduction with supination, heel varus, and ankle equines, which cannot be brought back to a neutral position. They were counseled that clubfeet can be an isolated difference, occur as a feature of an underlying syndrome, or the result of  neurological impairment. We discussed that the most likely mode of inheritance for nonsyndromic, isolated clubfoot/feet is multifactorial. There is a known genetic component for multifactorial clubfoot, as demonstrated by twin studies; environmental factors also play a role, including infection, drugs, and intrauterine environment (oligohydramnios, fetal positioning). While the majority of cases of clubfoot/feet are isolated, it is a feature in more than 200 known genetic syndromes, including both chromosomal and single gene conditions.   We reviewed chromosomes, nondisjunction and the features of Down syndrome, trisomy 143 and 156 We discussed that the risk for other chromosome aberrations is slightly increased (microdeletions, microduplications, insertions, translocations). We reviewed single gene conditions including common inheritance patterns and associated risks for recurrence. Mrs. SLastingerpreviously had noninvasive prenatal screening (NIPS)/prenatal cell free DNA testing facilitated through her OB office. This screen, [facility] was reported to be within normal limits. We reviewed the methodology of this screen, the conditions for which it typically assesses, and the sensitivity and specificity. Lab report was not available to uKoreaat the time of today's visit to confirm the [facility] and to confirm which conditions were included on the screen. We also reviewed her normal MSAFP screening results and the associated reduction in risks for an ONTD.   We discussed the option of amniocentesis, including the limitations, benefits, and risks for karyotype and chromosome microarray analysis to assess for other chromosome aberrations. We discussed the associated 1 in 3762-831risk for complications from amniocentesis, including the associated risk for spontaneous pregnancy loss. They understand that amniocentesis allows evaluation of the fetal chromosomes, but cannot  detect all genetic conditions. Specifically, we discussed that single gene conditions are difficult to diagnose prenatally unless a specific condition is suspected based on additional ultrasound findings or family history. Hannah Douglas amniocentesis given the associated risk for complications.  We reviewed that prenatal screening for single gene conditions associated with club foot is relatively limited in the absence of additional ultrasound or family history findings suggestive of a particular genetic condition. However, we reviewed various inheritance patterns of single gene conditions including autosomal dominant (inherited and de novo), X-linked, and autosomal recessive. Regarding autosomal recessive and some X-linked conditions, we discussed the option of expanded carrier screening for the patient and/or her partner.   We reviewed that ACOG currently recommends that all patients be offered carrier screening for cystic fibrosis, spinal muscular atrophy and hemoglobinopathies. In addition, they were counseled that there are a variety of genetic screening laboratories that have pan-ethnic, or expanded, carrier screening panels, which evaluate carrier status for a wide range of genetic conditions. Some of these conditions are severe and actionable, but also rare; others occur more commonly, but are less severe. We discussed that testing options range from screening for a single condition to panels of more than 175 autosomal or X-linked genetic conditions. We reviewed that the prevalence of each condition varies (and often varies with ethnicity). Thus the couples' background risk to be a carrier for each of these various conditions would range, and in some cases be very low or unknown. Similarly, the detection rate varies with each condition and also varies in some cases with ethnicity, ranging from greater than 99% (in the case of hemoglobinopathies) to unknown. We reviewed that a negative carrier  screen would thus reduce, but not eliminate the chance to be a carrier for these conditions. For some conditions included on specific pan-ethnic carrier screening panels, the pre-test carrier frequency and/or the detection rate is unknown. We discussed that the phenotypes of the conditions can also range and in some cases may not be well described. We discussed that some conditions on the panel may have clubfoot as an association feature, but many conditions included on the panel are not associated with club foot. We reviewed that in the event that one partner is found to be a carrier for one or more conditions, carrier screening would be available to the partner for those conditions. We discussed that carrier status typically is not associated with medical symptoms for the individual, but there are rare cases where carrier status for certain conditions may be associated with increased risk for specific medical features for the carrier. We reviewed risks, benefits, and limitations of expanded carrier screening. After thoughtful consideration of their options, Hannah Douglas elected to have the universal carrier screening panel through [facility]. This panel screens for carrier status of 175 hereditary disorders. We discussed that results will be available in approximately 2 weeks.   We discussed the option of meeting with a pediatric orthopedic specialist to discuss expectant management and treatment of clubfeet. They would like to pursue treatment [facility] their child at [facility]. Prenatal consultation was scheduled for 04/26/16.    Both family histories were reviewed and found to be contributory/noncontributory for birth defects, intellectual disability, and known genetic conditions. Without further information regarding the provided family history, an accurate genetic risk cannot be calculated. Further genetic counseling is warranted if more information is obtained.  Hannah Douglas denied exposure to environmental toxins or chemical agents. She denied the use of alcohol, tobacco or street drugs. She denied significant viral illnesses during the course of her pregnancy. Her medical and surgical histories were contributory for history of anxiety for which she is taking Prozac during the pregnancy. Prozac use during  pregnancy does not appear to increase the risk for birth defects.  Using Prozac late in pregnancy can increase the chance for a mild transient neonatal syndrome of central nervous system including irritability, vomiting, jitteriness and convulsions, as well as neonatal pulmonary hypertension.  These associated symptoms typically do not appear to occur frequently or severely.  I counseled this couple regarding the above risks and available options.  The approximate face-to-face time with the genetic counselor was 45 minutes.  Chipper Oman, MS Certified Genetic Counselor 04/17/2016

## 2016-04-20 ENCOUNTER — Other Ambulatory Visit: Payer: Self-pay

## 2016-04-20 DIAGNOSIS — Z31430 Encounter of female for testing for genetic disease carrier status for procreative management: Secondary | ICD-10-CM | POA: Diagnosis not present

## 2016-04-20 DIAGNOSIS — O358XX0 Maternal care for other (suspected) fetal abnormality and damage, not applicable or unspecified: Secondary | ICD-10-CM | POA: Diagnosis not present

## 2016-04-20 DIAGNOSIS — Z1371 Encounter for nonprocreative screening for genetic disease carrier status: Secondary | ICD-10-CM | POA: Diagnosis not present

## 2016-04-21 ENCOUNTER — Telehealth (HOSPITAL_COMMUNITY): Payer: Self-pay | Admitting: MS"

## 2016-04-21 DIAGNOSIS — Z8751 Personal history of pre-term labor: Secondary | ICD-10-CM | POA: Diagnosis not present

## 2016-04-21 NOTE — Telephone Encounter (Signed)
Attempted to contact the patient regarding results of carrier screening panel [facility]. Left message for patient to return call.   Clydie BraunKaren Jordi Lacko 04/21/2016 11:03 AM

## 2016-04-21 NOTE — Telephone Encounter (Signed)
Called Mrs. Ronni RumbleJennifer C Kaatz to discuss her carrier screening results. Mrs. Ronni RumbleJennifer C Effinger had carrier screening for 175 conditions, including the ACOG recommended conditions (SMA, CF, and hemoglobinopathies) through [facility]. The patient was identified by name and DOB. 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 We reviewed the autosomal recessive inheritance of this condition. Biotinidase deficiency is characterized by inability to process biotin due to enzymatic deficiency. Profound biotinidase deficiency is defined as less than 10% mean normal serum biotinidase activity, and partial biotinidase deficiency is characterized by 10-30% of normal serum biotinidase activity. If untreated, symptoms can include neurologic features, with partial biotinidase deficiency associated with less severe symptoms or symptoms only during times of stress.   We discussed that biotinidase deficiency is highly treatable, and management includes biotin supplementation. The treatment is lifelong and if started prior to symptoms, typically prevents symptoms. We discussed that biotinidase deficiency is included on the newborn screening panel in [place].   Prevalence is estimated to be 1 in 137,000 for profound biotinidase deficiency and 1 in 110,000 for partial biotinidase deficiency. Thus, prior to carrier screening for the patient's partner, risk for biotinidase deficiency in the current pregnancy is estimated to be 1 in 640. Approximately >99% of carriers will be identified by DNA sequencing of the BTD gene.   We discussed the option of carrier screening for her partner. There are different options available for carrier screening through [facility]. We discussed that he could be tested for only  biotinidase deficiency to establish a reproductive risk for their offspring together. Alternatively, he could have carrier testing for the entire panel. As we already know she is not a carrier for those other conditions on the panel, if he were to find out he were a carrier of a different condition the chance for that condition in an offspring would remain small. However, each offspring would have a 50% chance to be a carrier for any condition that either parent carriers. Thus, it may be information that he would like to have in future, or for his extended family. Mrs. Katrinka BlazingSmith stated that the couple would not likely pursue carrier screening for biotinidase deficiency at this time given that it is on the newborn screening panel and given the highly effective available treatment for the condition.   We discussed that her results for the remaining 174 conditions are negative, indicating that she does not have a detectable gene alteration in any of the genes for which analysis was performed. We reviewed that carrier screening does not detect all carriers of these conditions, but a normal result significantly decreases the likelihood of being a carrier, and therefore, the overall reproductive risk. We reviewed that [facility] sequences most of the genes, which is associated with a high detection rate for carriers, thus a negative screen is very reassuring. All questions were answered to her satisfaction, she was encouraged to call with additional questions or concerns. ? Quinn PlowmanKaren Gerald Kuehl, MS Patent attorneyCertified Genetic Counselor

## 2016-04-26 DIAGNOSIS — Z7189 Other specified counseling: Secondary | ICD-10-CM | POA: Diagnosis not present

## 2016-04-26 DIAGNOSIS — Q6689 Other  specified congenital deformities of feet: Secondary | ICD-10-CM | POA: Diagnosis not present

## 2016-04-28 DIAGNOSIS — O09219 Supervision of pregnancy with history of pre-term labor, unspecified trimester: Secondary | ICD-10-CM | POA: Diagnosis not present

## 2016-05-05 DIAGNOSIS — Z8751 Personal history of pre-term labor: Secondary | ICD-10-CM | POA: Diagnosis not present

## 2016-05-12 DIAGNOSIS — Z8751 Personal history of pre-term labor: Secondary | ICD-10-CM | POA: Diagnosis not present

## 2016-05-18 DIAGNOSIS — H5213 Myopia, bilateral: Secondary | ICD-10-CM | POA: Diagnosis not present

## 2016-05-18 DIAGNOSIS — H52223 Regular astigmatism, bilateral: Secondary | ICD-10-CM | POA: Diagnosis not present

## 2016-05-19 DIAGNOSIS — Z8751 Personal history of pre-term labor: Secondary | ICD-10-CM | POA: Diagnosis not present

## 2016-05-19 MED FILL — MAKENA 1,250 MG/5 ML VIAL: 250 | 30 days supply | Qty: 5 | Fill #2

## 2016-05-25 ENCOUNTER — Encounter: Payer: Self-pay | Admitting: Family Medicine

## 2016-05-26 DIAGNOSIS — Z8751 Personal history of pre-term labor: Secondary | ICD-10-CM | POA: Diagnosis not present

## 2016-05-30 DIAGNOSIS — Z23 Encounter for immunization: Secondary | ICD-10-CM | POA: Diagnosis not present

## 2016-05-30 DIAGNOSIS — O09219 Supervision of pregnancy with history of pre-term labor, unspecified trimester: Secondary | ICD-10-CM | POA: Diagnosis not present

## 2016-05-30 DIAGNOSIS — Z3480 Encounter for supervision of other normal pregnancy, unspecified trimester: Secondary | ICD-10-CM | POA: Diagnosis not present

## 2016-05-31 MED FILL — DICLEGIS DR 10-10 MG TABLET: 10-10 | 30 days supply | Qty: 120 | Fill #1

## 2016-06-07 DIAGNOSIS — O09219 Supervision of pregnancy with history of pre-term labor, unspecified trimester: Secondary | ICD-10-CM | POA: Diagnosis not present

## 2016-06-08 DIAGNOSIS — O99810 Abnormal glucose complicating pregnancy: Secondary | ICD-10-CM | POA: Diagnosis not present

## 2016-06-14 DIAGNOSIS — O09219 Supervision of pregnancy with history of pre-term labor, unspecified trimester: Secondary | ICD-10-CM | POA: Diagnosis not present

## 2016-06-14 DIAGNOSIS — Z6837 Body mass index (BMI) 37.0-37.9, adult: Secondary | ICD-10-CM | POA: Diagnosis not present

## 2016-06-21 DIAGNOSIS — O09219 Supervision of pregnancy with history of pre-term labor, unspecified trimester: Secondary | ICD-10-CM | POA: Diagnosis not present

## 2016-06-26 MED FILL — PROMETHAZINE 25 MG TABLET: 25 | 8 days supply | Qty: 30 | Fill #0

## 2016-06-26 MED FILL — TARON-C DHA CAPSULE: 53.5-38-1 | 90 days supply | Qty: 90 | Fill #0

## 2016-06-28 DIAGNOSIS — Z8751 Personal history of pre-term labor: Secondary | ICD-10-CM | POA: Diagnosis not present

## 2016-06-28 MED FILL — MAKENA 1,250 MG/5 ML VIAL: 250 | 30 days supply | Qty: 5 | Fill #0

## 2016-07-05 DIAGNOSIS — Z8751 Personal history of pre-term labor: Secondary | ICD-10-CM | POA: Diagnosis not present

## 2016-07-12 DIAGNOSIS — O26849 Uterine size-date discrepancy, unspecified trimester: Secondary | ICD-10-CM | POA: Diagnosis not present

## 2016-07-12 DIAGNOSIS — O09219 Supervision of pregnancy with history of pre-term labor, unspecified trimester: Secondary | ICD-10-CM | POA: Diagnosis not present

## 2016-07-12 DIAGNOSIS — Z8751 Personal history of pre-term labor: Secondary | ICD-10-CM | POA: Diagnosis not present

## 2016-07-19 DIAGNOSIS — O09219 Supervision of pregnancy with history of pre-term labor, unspecified trimester: Secondary | ICD-10-CM | POA: Diagnosis not present

## 2016-07-25 ENCOUNTER — Other Ambulatory Visit: Payer: Self-pay | Admitting: Obstetrics and Gynecology

## 2016-07-25 DIAGNOSIS — O09219 Supervision of pregnancy with history of pre-term labor, unspecified trimester: Secondary | ICD-10-CM | POA: Diagnosis not present

## 2016-07-25 DIAGNOSIS — Z3480 Encounter for supervision of other normal pregnancy, unspecified trimester: Secondary | ICD-10-CM | POA: Diagnosis not present

## 2016-07-25 DIAGNOSIS — Z369 Encounter for antenatal screening, unspecified: Secondary | ICD-10-CM | POA: Diagnosis not present

## 2016-07-25 LAB — OB RESULTS CONSOLE GBS: GBS: NEGATIVE

## 2016-08-02 DIAGNOSIS — Z8751 Personal history of pre-term labor: Secondary | ICD-10-CM | POA: Diagnosis not present

## 2016-08-02 DIAGNOSIS — Z369 Encounter for antenatal screening, unspecified: Secondary | ICD-10-CM | POA: Diagnosis not present

## 2016-08-09 DIAGNOSIS — O26843 Uterine size-date discrepancy, third trimester: Secondary | ICD-10-CM | POA: Diagnosis not present

## 2016-08-15 DIAGNOSIS — O368133 Decreased fetal movements, third trimester, fetus 3: Secondary | ICD-10-CM | POA: Diagnosis not present

## 2016-08-17 ENCOUNTER — Telehealth (HOSPITAL_COMMUNITY): Payer: Self-pay | Admitting: *Deleted

## 2016-08-17 ENCOUNTER — Encounter (HOSPITAL_COMMUNITY): Payer: Self-pay | Admitting: *Deleted

## 2016-08-17 NOTE — Telephone Encounter (Signed)
Preadmission screen  

## 2016-08-18 ENCOUNTER — Encounter (HOSPITAL_COMMUNITY): Payer: Self-pay | Admitting: *Deleted

## 2016-08-18 ENCOUNTER — Inpatient Hospital Stay (HOSPITAL_COMMUNITY)
Admission: AD | Admit: 2016-08-18 | Discharge: 2016-08-20 | DRG: 775 | Disposition: A | Discharge status: Home / Self Care | Payer: 59 | Source: Ambulatory Visit | Attending: Obstetrics and Gynecology | Admitting: Obstetrics and Gynecology

## 2016-08-18 DIAGNOSIS — Z3493 Encounter for supervision of normal pregnancy, unspecified, third trimester: Secondary | ICD-10-CM | POA: Diagnosis present

## 2016-08-18 DIAGNOSIS — O358XX0 Maternal care for other (suspected) fetal abnormality and damage, not applicable or unspecified: Principal | ICD-10-CM | POA: Diagnosis present

## 2016-08-18 DIAGNOSIS — Z3A38 38 weeks gestation of pregnancy: Secondary | ICD-10-CM | POA: Diagnosis not present

## 2016-08-18 DIAGNOSIS — O35HXX0 Maternal care for other (suspected) fetal abnormality and damage, fetal lower extremities anomalies, not applicable or unspecified: Secondary | ICD-10-CM

## 2016-08-18 LAB — CBC
HCT: 37.3 % (ref 36.0–46.0)
HCT: 36.2 % (ref 36.0–46.0)
Hemoglobin: 13.1 g/dL (ref 12.0–15.0)
Hemoglobin: 12.6 g/dL (ref 12.0–15.0)
MCH: 29.3 pg (ref 26.0–34.0)
MCH: 29.2 pg (ref 26.0–34.0)
MCHC: 35.1 g/dL (ref 30.0–36.0)
MCHC: 34.8 g/dL (ref 30.0–36.0)
MCV: 83.4 fL (ref 78.0–100.0)
MCV: 84 fL (ref 78.0–100.0)
Platelets: 294 10*3/uL (ref 150–400)
Platelets: 281 10*3/uL (ref 150–400)
RBC: 4.47 MIL/uL (ref 3.87–5.11)
RBC: 4.31 MIL/uL (ref 3.87–5.11)
RDW: 14.7 % (ref 11.5–15.5)
RDW: 14.6 % (ref 11.5–15.5)
WBC: 10.8 10*3/uL — ABNORMAL HIGH (ref 4.0–10.5)
WBC: 15.8 10*3/uL — ABNORMAL HIGH (ref 4.0–10.5)

## 2016-08-18 LAB — TYPE AND SCREEN
ABO/RH(D): AB POS
Antibody Screen: NEGATIVE

## 2016-08-18 LAB — RPR: RPR Ser Ql: NONREACTIVE

## 2016-08-18 LAB — ABO/RH: ABO/RH(D): AB POS

## 2016-08-18 MED ORDER — OXYCODONE-ACETAMINOPHEN 5-325 MG PO TABS: 1.0000 | ORAL_TABLET | ORAL | Status: DC | PRN

## 2016-08-18 MED ORDER — METHYLERGONOVINE MALEATE 0.2 MG/ML IJ SOLN
INTRAMUSCULAR | Status: AC
  Filled 2016-08-18: qty 1

## 2016-08-18 MED ORDER — ONDANSETRON HCL 4 MG/2ML IJ SOLN: 4.0000 mg | INTRAMUSCULAR | Status: DC | PRN

## 2016-08-18 MED ORDER — ONDANSETRON HCL 4 MG PO TABS: 4.0000 mg | ORAL_TABLET | ORAL | Status: DC | PRN

## 2016-08-18 MED ORDER — SOD CITRATE-CITRIC ACID 500-334 MG/5ML PO SOLN: 30.0000 mL | ORAL | Status: DC | PRN

## 2016-08-18 MED ORDER — ONDANSETRON HCL 4 MG/2ML IJ SOLN: 4.0000 mg | Freq: Four times a day (QID) | INTRAMUSCULAR | Status: DC | PRN

## 2016-08-18 MED ORDER — PRENATAL MULTIVITAMIN CH
1.0000 | ORAL_TABLET | Freq: Every day | ORAL | Status: DC
  Administered 2016-08-18 – 2016-08-19 (×2): 1 via ORAL
  Filled 2016-08-18 (×2): qty 1

## 2016-08-18 MED ORDER — METHYLERGONOVINE MALEATE 0.2 MG/ML IJ SOLN: 0.2000 mg | INTRAMUSCULAR | Status: DC | PRN

## 2016-08-18 MED ORDER — LIDOCAINE HCL (PF) 1 % IJ SOLN
30.0000 mL | INTRAMUSCULAR | Status: DC | PRN
  Filled 2016-08-18: qty 30

## 2016-08-18 MED ORDER — BENZOCAINE-MENTHOL 20-0.5 % EX AERO
1.0000 "application " | INHALATION_SPRAY | CUTANEOUS | Status: DC | PRN
  Filled 2016-08-18: qty 56

## 2016-08-18 MED ORDER — COCONUT OIL OIL: 1.0000 "application " | TOPICAL_OIL | Status: DC | PRN

## 2016-08-18 MED ORDER — SODIUM CHLORIDE 0.9% FLUSH: 3.0000 mL | INTRAVENOUS | Status: DC | PRN

## 2016-08-18 MED ORDER — BUPROPION HCL ER (XL) 300 MG PO TB24
300.0000 mg | ORAL_TABLET | Freq: Every day | ORAL | Status: DC
  Filled 2016-08-18 (×3): qty 1

## 2016-08-18 MED ORDER — LIDOCAINE HCL (PF) 1 % IJ SOLN
INTRAMUSCULAR | Status: AC
  Filled 2016-08-18: qty 30

## 2016-08-18 MED ORDER — OXYCODONE-ACETAMINOPHEN 5-325 MG PO TABS: 2.0000 | ORAL_TABLET | ORAL | Status: DC | PRN

## 2016-08-18 MED ORDER — SIMETHICONE 80 MG PO CHEW
80.0000 mg | CHEWABLE_TABLET | ORAL | Status: DC | PRN
Start: 2016-08-18 — End: 2016-08-20

## 2016-08-18 MED ORDER — DIPHENHYDRAMINE HCL 25 MG PO CAPS: 25.0000 mg | ORAL_CAPSULE | Freq: Four times a day (QID) | ORAL | Status: DC | PRN

## 2016-08-18 MED ORDER — SODIUM CHLORIDE 0.9 % IV SOLN: 250.0000 mL | INTRAVENOUS | Status: DC | PRN

## 2016-08-18 MED ORDER — OXYTOCIN 40 UNITS IN LACTATED RINGERS INFUSION - SIMPLE MED
INTRAVENOUS | Status: AC
  Filled 2016-08-18: qty 1000

## 2016-08-18 MED ORDER — OXYTOCIN BOLUS FROM INFUSION
500.0000 mL | Freq: Once | INTRAVENOUS | Status: AC
  Administered 2016-08-18: 500 mL via INTRAVENOUS

## 2016-08-18 MED ORDER — OXYTOCIN 40 UNITS IN LACTATED RINGERS INFUSION - SIMPLE MED: 2.5000 [IU]/h | INTRAVENOUS | Status: DC

## 2016-08-18 MED ORDER — LACTATED RINGERS IV SOLN: 500.0000 mL | INTRAVENOUS | Status: DC | PRN

## 2016-08-18 MED ORDER — ACETAMINOPHEN 325 MG PO TABS: 650.0000 mg | ORAL_TABLET | ORAL | Status: DC | PRN

## 2016-08-18 MED ORDER — METHYLERGONOVINE MALEATE 0.2 MG/ML IJ SOLN
0.2000 mg | Freq: Once | INTRAMUSCULAR | Status: AC
  Administered 2016-08-18: 0.2 mg via INTRAMUSCULAR

## 2016-08-18 MED ORDER — IBUPROFEN 800 MG PO TABS
800.0000 mg | ORAL_TABLET | Freq: Three times a day (TID) | ORAL | Status: DC
  Administered 2016-08-18 – 2016-08-20 (×7): 800 mg via ORAL
  Filled 2016-08-18 (×7): qty 1

## 2016-08-18 MED ORDER — OXYCODONE-ACETAMINOPHEN 5-325 MG PO TABS
1.0000 | ORAL_TABLET | ORAL | Status: DC | PRN
  Administered 2016-08-18: 1 via ORAL
  Filled 2016-08-18: qty 1

## 2016-08-18 MED ORDER — FLUOXETINE HCL 20 MG PO CAPS
20.0000 mg | ORAL_CAPSULE | Freq: Every day | ORAL | Status: DC
  Administered 2016-08-18 – 2016-08-20 (×3): 20 mg via ORAL
  Filled 2016-08-18 (×3): qty 1

## 2016-08-18 MED ORDER — MEASLES, MUMPS & RUBELLA VAC ~~LOC~~ INJ
0.5000 mL | INJECTION | Freq: Once | SUBCUTANEOUS | Status: DC
  Filled 2016-08-18: qty 0.5

## 2016-08-18 MED ORDER — SENNOSIDES-DOCUSATE SODIUM 8.6-50 MG PO TABS
2.0000 | ORAL_TABLET | ORAL | Status: DC
  Administered 2016-08-18: 2 via ORAL
  Filled 2016-08-18: qty 2

## 2016-08-18 MED ORDER — ZOLPIDEM TARTRATE 5 MG PO TABS: 5.0000 mg | ORAL_TABLET | Freq: Every evening | ORAL | Status: DC | PRN

## 2016-08-18 MED ORDER — METHYLERGONOVINE MALEATE 0.2 MG PO TABS: 0.2000 mg | ORAL_TABLET | ORAL | Status: DC | PRN

## 2016-08-18 MED ORDER — MAGNESIUM HYDROXIDE 400 MG/5ML PO SUSP
30.0000 mL | ORAL | Status: DC | PRN
Start: 2016-08-18 — End: 2016-08-20

## 2016-08-18 MED ORDER — DIBUCAINE 1 % RE OINT: 1.0000 "application " | TOPICAL_OINTMENT | RECTAL | Status: DC | PRN

## 2016-08-18 MED ORDER — FERROUS SULFATE 325 (65 FE) MG PO TABS
325.0000 mg | ORAL_TABLET | Freq: Two times a day (BID) | ORAL | Status: DC
  Administered 2016-08-18 – 2016-08-20 (×4): 325 mg via ORAL
  Filled 2016-08-18 (×5): qty 1

## 2016-08-18 MED ORDER — WITCH HAZEL-GLYCERIN EX PADS: 1.0000 "application " | MEDICATED_PAD | CUTANEOUS | Status: DC | PRN

## 2016-08-18 MED ORDER — LACTATED RINGERS IV SOLN
INTRAVENOUS | Status: DC
  Administered 2016-08-18: 02:00:00 via INTRAVENOUS

## 2016-08-18 MED ORDER — FLEET ENEMA 7-19 GM/118ML RE ENEM: 1.0000 | ENEMA | RECTAL | Status: DC | PRN

## 2016-08-18 MED ORDER — SODIUM CHLORIDE 0.9% FLUSH: 3.0000 mL | Freq: Two times a day (BID) | INTRAVENOUS | Status: DC

## 2016-08-18 MED ORDER — BUTORPHANOL TARTRATE 1 MG/ML IJ SOLN
1.0000 mg | INTRAMUSCULAR | Status: DC | PRN
  Filled 2016-08-18: qty 1

## 2016-08-18 MED ORDER — TETANUS-DIPHTH-ACELL PERTUSSIS 5-2.5-18.5 LF-MCG/0.5 IM SUSP: 0.5000 mL | Freq: Once | INTRAMUSCULAR | Status: DC

## 2016-08-18 NOTE — Discharge Summary (Signed)
Obstetric Discharge Summary Reason for Admission: onset of labor Prenatal Procedures: none Intrapartum Procedures: spontaneous vaginal delivery Postpartum Procedures: none Complications-Operative and Postpartum: none Hemoglobin  Date Value Ref Range Status  08/18/2016 12.6 12.0 - 15.0 g/dL Final   HCT  Date Value Ref Range Status  08/18/2016 36.2 36.0 - 46.0 % Final    Physical Exam:  General: alert, cooperative and appears stated age 25Lochia: appropriate Uterine Fundus: firm DVT Evaluation: No evidence of DVT seen on physical exam.  Discharge Diagnoses: Term Pregnancy-delivered  Discharge Information: Date: 08/18/2016 Activity: pelvic rest Diet: routine Medications: PNV and Ibuprofen Condition: stable Instructions: refer to practice specific booklet Discharge to: home Follow-up Information    Carrington ClampHorvath, Michelle, MD. Call in 4 week(s).   Specialty:  Obstetrics and Gynecology Contact information: [address] [place] [address] [phone]           Newborn Data: Live born female  Birth Weight: 8 lb 1.3 oz (3665 g) APGAR: 8, 9  Home with mother.  Priest Lockridge GEFFEL Jovonna Nickell 08/18/2016, 12:21 PM

## 2016-08-18 NOTE — Lactation Note (Addendum)
This note was copied from a baby's chart. Lactation Consultation Note  Patient Name: Hannah Roney MansJennifer Hessel ZOXWR'UToday's Date: 08/18/2016 Reason for consult: Initial assessment  Visited with P3 Mom, baby 10 hrs old.  Mom states baby latches and feeds well.  Hand expresses prior to latch Encouraged STS and cue based feedings, goal of 8-12 feedings per 24 hrs. Brochure given to Mom, informed of IP and OP lactation services available to her.  Encouraged Mom to call prn.  Consult Status Consult Status: Follow-up Date: 08/19/16 Follow-up type: In-patient    Judee ClaraSmith, Debe Anfinson E 08/18/2016, 12:56 PM

## 2016-08-18 NOTE — MAU Note (Signed)
PT  SAYS  SROM  AT 0045-         VE IN OFFICE  4  CM    GBS- NEG

## 2016-08-18 NOTE — MAU Note (Addendum)
Pt reports small amount fluid which trickled down her leg @ 0045. No further leaking since

## 2016-08-18 NOTE — H&P (Signed)
31 y.o. 4079w4d  G3P1102 comes in c/o labor.  Otherwise has good fetal movement and no bleeding.  Past Medical History:  Diagnosis Date  . Anxiety   . Depression   . Hypothyroidism     Past Surgical History:  Procedure Laterality Date  . NO PAST SURGERIES      OB History  Gravida Para Term Preterm AB Living  3 2 1 1   2   SAB TAB Ectopic Multiple Live Births          2    # Outcome Date GA Lbr Len/2nd Weight Sex Delivery Anes PTL Lv  3 Current           2 Term 2012 9745w0d  7 lb 8 oz (3.402 kg) M Vag-Spont   LIV  1 Preterm 2001 2524w0d  3 lb 14 oz (1.758 kg) M Vag-Spont   LIV      Social History   Social History  . Marital status: Married    Spouse name: N/A  . Number of children: N/A  . Years of education: N/A   Occupational History  . Not on file.   Social History Main Topics  . Smoking status: Never Smoker  . Smokeless tobacco: Never Used  . Alcohol use No  . Drug use: No  . Sexual activity: Yes    Birth control/ protection: None   Other Topics Concern  . 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    Prenatal Transfer Tool  Maternal Diabetes: No Genetic Screening: Normal Maternal Ultrasounds/Referrals: Abnormal:  Findings:   Other:bilateral club feet Fetal Ultrasounds or other Referrals:  None, Referred to Materal Fetal Medicine and peds ortho Maternal Substance Abuse:  No Significant Maternal Medications:  None Significant Maternal Lab Results: None  Other PNC: uncomplicated.    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  GBS neg.  Korver Graybeal A

## 2016-08-18 NOTE — Progress Notes (Signed)
Patient is doing well.  She is ambulating, voiding, tolerating PO.  Pain control is good.  Lochia is appropriate  Vitals:   08/18/16 0400 08/18/16 0431 08/18/16 0533 08/18/16 0949  BP: 115/75 118/63 108/75 114/75  Pulse: 71 74 83 81  Resp: 18 18 18 20   Temp:  98.1 F (36.7 C) 98.4 F (36.9 C) 97.8 F (36.6 C)  TempSrc:  Oral Oral Oral  SpO2:      Weight:      Height:        NAD Fundus firm Ext: no edema  Lab Results  Component Value Date   WBC 15.8 (H) 08/18/2016   HGB 12.6 08/18/2016   HCT 36.2 08/18/2016   MCV 84.0 08/18/2016   PLT 281 08/18/2016    --/--/AB POS, AB POS (05/25 0225)/RImmune  A/P 30 y.o. [id number] PPD#0. Routine care.   Expect d/c Sunday on ppd#2 per pt request.  Desires circumcision. Discussed r/b/a of the procedure. Reviewed that circumcision is an elective surgical procedure and not considered medically necessary. Reviewed the risks of the procedure including the risk of infection, bleeding, damage to surrounding structures, including scrotum, shaft, urethra and head of penis, and an undesired cosmetic effect requiring additional procedures for revision. Consent signed. Will plan for tomorrow per pt request     [facility] CampusDYANNA GEFFEL [place] HillLARK

## 2016-08-19 NOTE — Lactation Note (Signed)
This note was copied from a baby's chart. Lactation Consultation Note  Baby 32 hours old.  9.1% weight loss.  Baby's has had good voids/stools. Mother latched baby in football hold.  Baby sleepy at breast after circ. Encouraged mother to compress breast during feeding to keep baby active. Suggest she breastfeed on both breast q 3 hours or sooner w/ cues, waking baby if needed to help stabilize weight loss. Recommend supplementing w/ hand expressed breastmilk on spoon before or after feedings.    Patient Name: Hannah Douglas'UToday's Date: 08/19/2016 Reason for consult: Follow-up assessment   Maternal Data    Feeding Feeding Type: Breast Fed Length of feed: 20 min  LATCH Score/Interventions Latch: Grasps breast easily, tongue down, lips flanged, rhythmical sucking. Intervention(s): Adjust position;Assist with latch;Breast massage;Breast compression  Audible Swallowing: A few with stimulation Intervention(s): Skin to skin  Type of Nipple: Everted at rest and after stimulation  Comfort (Breast/Nipple): Soft / non-tender     Hold (Positioning): Assistance needed to correctly position infant at breast and maintain latch.  LATCH Score: 8  Lactation Tools Discussed/Used     Consult Status Consult Status: Follow-up Date: 08/20/16 Follow-up type: In-patient    Dahlia ByesBerkelhammer, Ruth Dreyer [facility] CenterBoschen 08/19/2016, 11:35 AM

## 2016-08-19 NOTE — Progress Notes (Signed)
Patient is doing well.  She is ambulating, voiding, tolerating PO.  Pain control is good.  Lochia is appropriate  Vitals:   08/18/16 0533 08/18/16 0949 08/18/16 1700 08/19/16 0530  BP: 108/75 114/75 113/68 126/80  Pulse: 83 81 61 78  Resp: 18 20 19 18   Temp: 98.4 F (36.9 C) 97.8 F (36.6 C) 97.7 F (36.5 C) 98.4 F (36.9 C)  TempSrc: Oral Oral Oral Oral  SpO2:    99%  Weight:      Height:        NAD Fundus firm Ext: no edema  Lab Results  Component Value Date   WBC 15.8 (H) 08/18/2016   HGB 12.6 08/18/2016   HCT 36.2 08/18/2016   MCV 84.0 08/18/2016   PLT 281 08/18/2016    --/--/AB POS, AB POS (05/25 0225)/RImmune  A/P 30 y.o. [id number] PPD#1. Routine care.   Expect d/c Sunday on ppd#2 per pt request.  Desires circumcision. Discussed r/b/a of the procedure. Reviewed that circumcision is an elective surgical procedure and not considered medically necessary. Reviewed the risks of the procedure including the risk of infection, bleeding, damage to surrounding structures, including scrotum, shaft, urethra and head of penis, and an undesired cosmetic effect requiring additional procedures for revision. Consent signed. Will plan for tomorrow per pt request     [facility] HospitalDYANNA Douglas [place]

## 2016-08-19 NOTE — Progress Notes (Signed)
MOB was referred for history of depression/anxiety. * Referral screened out by Clinical Social Worker because none of the following criteria appear to apply: ~ History of anxiety/depression during this pregnancy, or of post-partum depression. ~ Diagnosis of anxiety and/or depression within last 3 years OR * MOB's symptoms currently being treated with medication and/or therapy.  CSW completed chart review and MOB has a long hx of anxiety/depression and has a psychiatrist that does her medication management.   Please contact the Clinical Social Worker if needs arise, or if MOB requests.  Terrence Pizana Boyd-Gilyard, MSW, LCSW Clinical Social Work [phone]  

## 2016-08-20 MED ORDER — IBUPROFEN 600 MG PO TABS: 600.0000 mg | ORAL_TABLET | Freq: Four times a day (QID) | ORAL | 0 refills | Status: DC | PRN

## 2016-08-20 MED ORDER — OXYCODONE HCL 5 MG PO CAPS: 5.0000 mg | ORAL_CAPSULE | Freq: Four times a day (QID) | ORAL | 0 refills | Status: DC | PRN

## 2016-08-20 NOTE — Progress Notes (Signed)
Patient is doing well.  She is ambulating, voiding, tolerating PO.  Pain control is good.  Lochia is appropriate  Vitals:   08/19/16 0530 08/19/16 1803 08/20/16 0133 08/20/16 0834  BP: 126/80 118/74 120/72 129/85  Pulse: 78 73 70 85  Resp: 18 12 16 16   Temp: 98.4 F (36.9 C) 97.9 F (36.6 C) 97.8 F (36.6 C) 97.9 F (36.6 C)  TempSrc: Oral Oral Oral Oral  SpO2: 99% 99%    Weight:      Height:        NAD Fundus firm Ext: no edema  Lab Results  Component Value Date   WBC 15.8 (H) 08/18/2016   HGB 12.6 08/18/2016   HCT 36.2 08/18/2016   MCV 84.0 08/18/2016   PLT 281 08/18/2016    --/--/AB POS, AB POS (05/25 0225)/RImmune  A/P 30 y.o. [id number] PPD#2. Routine care.   Meeting all goals--d/c today     [facility] Douglas [place]

## 2016-08-20 NOTE — Lactation Note (Signed)
This note was copied from a baby's chart. Lactation Consultation Note  Patient Name: Hannah Douglas ZOXWR'UToday's Date: 08/20/2016 Reason for consult: Follow-up assessment   Baby 55 hours old and latching well.  Latched and re-latched during consult. Intermittent sucks and swallows observed. Mother doing a good job of compressing breasts during feeding. Mother has history of hypothyroidism.  Recommend she discuss with her MD about levels postpartum. Provided mother w/ manual pump. Reviewed engorgement care and monitoring voids/stools. Mom encouraged to feed baby 8-12 times/24 hours and with feeding cues.     Maternal Data    Feeding Feeding Type: Breast Fed Length of feed: 15 min  LATCH Score/Interventions Latch: Grasps breast easily, tongue down, lips flanged, rhythmical sucking.  Audible Swallowing: A few with stimulation  Type of Nipple: Everted at rest and after stimulation  Comfort (Breast/Nipple): Soft / non-tender     Hold (Positioning): No assistance needed to correctly position infant at breast.  LATCH Score: 9  Lactation Tools Discussed/Used     Consult Status Consult Status: Complete    Hardie PulleyBerkelhammer, Ruth Boschen 08/20/2016, 10:11 AM

## 2016-08-22 ENCOUNTER — Inpatient Hospital Stay (HOSPITAL_COMMUNITY): Admission: RE | Admit: 2016-08-22 | Payer: 59 | Source: Ambulatory Visit

## 2016-08-25 ENCOUNTER — Ambulatory Visit (HOSPITAL_COMMUNITY)
Admission: RE | Admit: 2016-08-25 | Discharge: 2016-08-25 | Disposition: A | Discharge status: Home / Self Care | Payer: 59 | Source: Ambulatory Visit | Attending: Family Medicine | Admitting: Family Medicine

## 2016-08-25 ENCOUNTER — Other Ambulatory Visit (HOSPITAL_COMMUNITY): Payer: Self-pay | Admitting: Obstetrics & Gynecology

## 2016-08-25 DIAGNOSIS — M7989 Other specified soft tissue disorders: Secondary | ICD-10-CM

## 2016-08-25 DIAGNOSIS — M79605 Pain in left leg: Secondary | ICD-10-CM

## 2016-08-25 NOTE — Progress Notes (Signed)
**  Preliminary report by tech**  Left lower extremity venous duplex complete. There is no evidence of deep or superficial vein thrombosis involving the left lower extremity. All visualized vessels appear patent and compressible. There is no evidence of a Baker's cyst on the left. [facility].  08/25/16 4:38 PM Olen CordialGreg Vista Sawatzky RVT

## 2016-08-29 ENCOUNTER — Telehealth (HOSPITAL_COMMUNITY): Payer: Self-pay

## 2016-09-06 NOTE — Telephone Encounter (Signed)
error 

## 2016-09-25 MED FILL — FLUoxetine HCL 20 MG CAPS: 20 | 30 days supply | Qty: 90 | Fill #3

## 2016-10-07 ENCOUNTER — Telehealth: Payer: 59 | Admitting: Nurse Practitioner

## 2016-10-07 DIAGNOSIS — S80861A Insect bite (nonvenomous), right lower leg, initial encounter: Secondary | ICD-10-CM | POA: Diagnosis not present

## 2016-10-07 DIAGNOSIS — W57XXXA Bitten or stung by nonvenomous insect and other nonvenomous arthropods, initial encounter: Secondary | ICD-10-CM

## 2016-10-07 MED ORDER — CEPHALEXIN 500 MG PO CAPS: 500.0000 mg | ORAL_CAPSULE | Freq: Three times a day (TID) | ORAL | 0 refills | Status: DC

## 2016-10-07 NOTE — Progress Notes (Signed)
E Visit for Insect Sting  Thank you for describing the insect sting for [place].  Here is how we plan to help!  Based on the information you have shared with me it looks like you have:   The 2 greatest risk from insect stings are allergic reaction, which can be fatal in some people and infection, which is more common and less serious. It appears to me that you may have an infection. I have sent in keflex 500 mg 1 po 3x a day for 7 days.  Bees, wasps, yellow jackets, and hornets belong to a class of insects called Hymenoptera.  Most insect stings cause only minor discomfort.  Stings can happen anywhere on the body and can be painful.  Most stings are from honey bees or yellow jackets.  Fire ants can sting multiple times.  The sites of the stings are more likely to become infected.      What can be used to prevent Insect Stings?   Insect repellant with at least 20% DEET.    Wearing long pants and shirts with socks and shoes.    Wear dark or drab-colored clothes rather than bright colors.    Avoid using perfumes and hair sprays; these attract insects.  HOME CARE ADVICE:  1. Stinger removal:  The stinger looks like a tiny black dot in the sting.  Use a fingernail, credit card edge, or knife-edge to scrape it off.  Don't pull it out because it squeezes out more venom.  If the stinger is below the skin surface, leave it alone.  It will be shed with normal skin healing. 2. Use cold compresses to the area of the sting for 10-20 minutes.  You may repeat this as needed to relieve symptoms of pain and swelling. 3.  For pain relief, take acetominophen 650 mg 4-6 hours as needed or ibuprofen 400 mg every 6-8 hours as needed or naproxen 250-500 mg every 12 hours as needed. 4.  You can also use hydrocortisone cream 0.5% or 1% up to 4 times daily as needed for itching. 5.  If the sting becomes very itchy, take Benadryl 25-50 mg, follow directions on box. 6.  Wash the area 2-3 times daily with  antibacterial soap and warm water. 7. Call your Doctor if:  Fever, a severe headache, or rash occur in the next 2 weeks.  Sting area begins to look infected.  Redness and swelling worsens after home treatment.  Your current symptoms become worse.    MAKE SURE YOU:   Understand these instructions.  Will watch your condition.  Will get help right away if you are not doing well or get worse.  Thank you for choosing an e-visit. Your e-visit answers were reviewed by a board certified advanced clinical practitioner to complete your personal care plan. Depending upon the condition, your plan could have included both over the counter or prescription medications. Please review your pharmacy choice. Be sure that the pharmacy you have chosen is open so that you can pick up your prescription now.  If there is a problem you may message your provider in MyChart to have the prescription routed to another pharmacy. Your safety is important to [place]. If you have drug allergies check your prescription carefully.  For the next 24 hours, you can use MyChart to ask questions about today's visit, request a non-urgent call back, or ask for a work or school excuse from your e-visit provider. You will get an email in the next two  days asking about your experience. I hope that your e-visit has been valuable and will speed your recovery.

## 2016-10-26 MED FILL — FLUoxetine HCL 20 MG CAPS: 20 | 30 days supply | Qty: 90 | Fill #0

## 2016-12-13 MED FILL — ALPRAZolam 0.5 MG TABS: 0.5 | 90 days supply | Qty: 180 | Fill #0

## 2016-12-14 ENCOUNTER — Encounter: Payer: Self-pay | Admitting: Family Medicine

## 2016-12-14 DIAGNOSIS — F4323 Adjustment disorder with mixed anxiety and depressed mood: Secondary | ICD-10-CM | POA: Diagnosis not present

## 2017-01-18 ENCOUNTER — Ambulatory Visit (INDEPENDENT_AMBULATORY_CARE_PROVIDER_SITE_OTHER): Payer: 59 | Admitting: Family Medicine

## 2017-01-18 ENCOUNTER — Ambulatory Visit (INDEPENDENT_AMBULATORY_CARE_PROVIDER_SITE_OTHER)
Admission: RE | Admit: 2017-01-18 | Discharge: 2017-01-18 | Disposition: A | Discharge status: Home / Self Care | Payer: 59 | Source: Ambulatory Visit | Attending: Family Medicine | Admitting: Family Medicine

## 2017-01-18 ENCOUNTER — Telehealth: Payer: 59 | Admitting: Family

## 2017-01-18 ENCOUNTER — Encounter: Payer: Self-pay | Admitting: Family Medicine

## 2017-01-18 VITALS — BP 100/80 | HR 92 | Temp 97.6°F | Ht 65.0 in | Wt 223.2 lb

## 2017-01-18 DIAGNOSIS — R6889 Other general symptoms and signs: Secondary | ICD-10-CM | POA: Diagnosis not present

## 2017-01-18 DIAGNOSIS — R52 Pain, unspecified: Secondary | ICD-10-CM | POA: Diagnosis not present

## 2017-01-18 DIAGNOSIS — J989 Respiratory disorder, unspecified: Secondary | ICD-10-CM

## 2017-01-18 DIAGNOSIS — R079 Chest pain, unspecified: Secondary | ICD-10-CM | POA: Diagnosis not present

## 2017-01-18 DIAGNOSIS — J029 Acute pharyngitis, unspecified: Secondary | ICD-10-CM

## 2017-01-18 LAB — POCT RAPID STREP A (OFFICE): Rapid Strep A Screen: NEGATIVE

## 2017-01-18 LAB — POC INFLUENZA A&B (BINAX/QUICKVUE)
Influenza A, POC: NEGATIVE
Influenza B, POC: NEGATIVE

## 2017-01-18 NOTE — Progress Notes (Signed)
Thank you for the details you included in the comment boxes. Those details are very helpful in determining the best course of treatment for you and help us to provide the best care.  E visit for Flu like symptoms   We are sorry that you are not feeling well.  Here is how we plan to help! Based on what you have shared with me it looks like you may have flu-like symptoms that should be watched but do not seem to indicate anti-viral treatment.  Influenza or "the flu" is   an infection caused by a respiratory virus. The flu virus is highly contagious and persons who did not receive their yearly flu vaccination may "catch" the flu from close contact.  We have anti-viral medications to treat the viruses that cause this infection. They are not a "cure" and only shorten the course of the infection. These prescriptions are most effective when they are given within the first 2 days of "flu" symptoms. Antiviral medication are indicated if you have a high risk of complications from the flu. You should  also consider an antiviral medication if you are in close contact with someone who is at risk. These medications can help patients avoid complications from the flu  but have side effects that you should know. Possible side effects from Tamiflu or oseltamivir include nausea, vomiting, diarrhea, dizziness, headaches, eye redness, sleep problems or other respiratory symptoms. You should not take Tamiflu if you have an allergy to oseltamivir or any to the ingredients in Tamiflu.  Based upon your symptoms and potential risk factors I recommend that you follow the flu symptoms recommendation that I have listed below.  ANYONE WHO HAS FLU SYMPTOMS SHOULD: . Stay home. The flu is highly contagious and going out or to work exposes others! . Be sure to drink plenty of fluids. Water is fine as well as fruit juices, sodas and electrolyte beverages. You may want to stay away from caffeine or alcohol. If you are nauseated, try  taking small sips of liquids. How do you know if you are getting enough fluid? Your urine should be a pale yellow or almost colorless. . Get rest. . Taking a steamy shower or using a humidifier may help nasal congestion and ease sore throat pain. Using a saline nasal spray works much the same way. . Cough drops, hard candies and sore throat lozenges may ease your cough. . Line up a caregiver. Have someone check on you regularly.   GET HELP RIGHT AWAY IF: . You cannot keep down liquids or your medications. . You become short of breath . Your fell like you are going to pass out or loose consciousness. . Your symptoms persist after you have completed your treatment plan MAKE SURE YOU   Understand these instructions.  Will watch your condition.  Will get help right away if you are not doing well or get worse.  Your e-visit answers were reviewed by a board certified advanced clinical practitioner to complete your personal care plan.  Depending on the condition, your plan could have included both over the counter or prescription medications.  If there is a problem please reply  once you have received a response from your provider.  Your safety is important to us.  If you have drug allergies check your prescription carefully.    You can use MyChart to ask questions about today's visit, request a non-urgent call back, or ask for a work or school excuse for 24 hours related to   this e-Visit. If it has been greater than 24 hours you will need to follow up with your provider, or enter a new e-Visit to address those concerns.  You will get an e-mail in the next two days asking about your experience.  I hope that your e-visit has been valuable and will speed your recovery. Thank you for using e-visits.   

## 2017-01-18 NOTE — Patient Instructions (Signed)
BEFORE YOU LEAVE: -rapid strep -flu test -xray sheet -follow up: as needed  Get the xray  BolivarPlenty of fluids  Tylenol per instructions as needed for pain  Gargle salt water  Seek care promptly if worsening, new concerns or not improving as expected

## 2017-01-18 NOTE — Progress Notes (Signed)
HPI:  Acute visit for:  Sore throat and body aches -started: e days ago -symptoms:sore throat, mild malaise, bodyaches in shoulders and chest mainly -denies:fever, SOB, NVD, tooth pain -has tried: nothing -sick contacts/travel/risks: no reported flu, strep or tick exposure - family members with virus and fevers, cough, runny nose -lactating  ROS: See pertinent positives and negatives per HPI.  Past Medical History:  Diagnosis Date  . Anxiety   . Depression   . Hypothyroidism     Past Surgical History:  Procedure Laterality Date  . NO PAST SURGERIES      Family History  Problem Relation Age of Onset  . Alcohol abuse Neg Hx   . Arthritis Neg Hx   . Asthma Neg Hx   . Birth defects Neg Hx   . Cancer Neg Hx   . COPD Neg Hx   . Depression Neg Hx   . Diabetes Neg Hx   . Drug abuse Neg Hx   . Early death Neg Hx   . Hearing loss Neg Hx   . Heart disease Neg Hx   . Hyperlipidemia Neg Hx   . Hypertension Neg Hx   . Kidney disease Neg Hx   . Learning disabilities Neg Hx   . Mental illness Neg Hx   . Mental retardation Neg Hx   . Miscarriages / Stillbirths Neg Hx   . Stroke Neg Hx   . Vision loss Neg Hx   . Varicose Veins Neg Hx     Social History   Social History  . Marital status: Married    Spouse name: N/A  . Number of children: N/A  . Years of education: N/A   Social History Main Topics  . Smoking status: Never Smoker  . Smokeless tobacco: Never Used  . Alcohol use No  . Drug use: No  . Sexual activity: Yes    Birth control/ protection: None   Other Topics Concern  . 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  Prenatal Vit-Fe Fumarate-FA (PRENATAL VITAMIN PO), Take by mouth., Disp: , Rfl:   EXAM:  Vitals:   01/18/17 1445  BP: 100/80  Pulse: 92  Temp: 97.6 F (36.4 C)  SpO2: 98%    Body mass index is 37.14 kg/m.  GENERAL: vitals reviewed and listed above, alert, oriented, appears well hydrated and in no acute distress  HEENT: atraumatic, conjunttiva clear, no obvious abnormalities on inspection of external nose and ears, normal appearance of ear canals and TMs, clear nasal congestion, mild post oropharyngeal erythema with PND, no tonsillar edema or exudate, no sinus TTP  NECK: no obvious masses on inspection  LUNGS: clear to auscultation bilaterally, no wheezes, rales or rhonchi, good air movement  CV: HRRR, no peripheral edema  MS: moves all extremities without noticeable abnormality, TTP shoulder/chest  PSYCH: pleasant and cooperative, no obvious depression or anxiety  ASSESSMENT AND PLAN:  Discussed the following assessment and plan:  Respiratory illness - Plan: DG  Chest 2 View, POC Rapid Strep A  Sore throat - Plan: POC Rapid Strep A  Body aches - Plan: POC Influenza A&B(BINAX/QUICKVUE)  -given HPI and exam findings today, a serious infection or illness is unlikely. We discussed potential etiologies, with VURI being most likely, and advised supportive care and monitoring. She has children at home and wanted to test for the flu, strep and we also opted to do a chest x-ray given the chest discomfort, though this is likely body aches associated viral illness. Does unlikely she has the flu given she is feeling pretty well overall and has no fever, but we will check a rapid flu test. At this point given she's been sick for 3 days it is unlikely that she would benefit from Tamiflu, and opted for symptomatic and supportive care with close follow up if worsening or new concerns.We discussed treatment side effects, likely course, antibiotic  misuse, transmission, and signs of developing a serious illness. -of course, we advised to return or notify a doctor immediately if symptoms worsen or persist or new concerns arise.    Patient Instructions  BEFORE YOU LEAVE: -rapid strep -flu test -xray sheet -follow up: as needed  Get the xray  Plenty of fluids  Tylenol per instructions as needed for pain  Gargle salt water  Seek care promptly if worsening, new concerns or not improving as expected   Kriste BasqueKIM, Anquan Azzarello R., DO

## 2017-01-29 MED FILL — FLUoxetine HCL 20 MG CAPS: 20 | 30 days supply | Qty: 90 | Fill #1 | Status: TO

## 2017-04-05 ENCOUNTER — Encounter: Payer: Self-pay | Admitting: Family Medicine

## 2017-04-10 MED FILL — FLUoxetine HCL 20 MG CAPS: 20 | 30 days supply | Qty: 90 | Fill #0

## 2017-04-12 ENCOUNTER — Ambulatory Visit (INDEPENDENT_AMBULATORY_CARE_PROVIDER_SITE_OTHER): Payer: 59 | Admitting: Family Medicine

## 2017-04-12 ENCOUNTER — Encounter: Payer: Self-pay | Admitting: Family Medicine

## 2017-04-12 VITALS — BP 100/76 | HR 82 | Temp 98.0°F | Ht 65.0 in | Wt 229.0 lb

## 2017-04-12 DIAGNOSIS — Z0000 Encounter for general adult medical examination without abnormal findings: Secondary | ICD-10-CM | POA: Diagnosis not present

## 2017-04-12 DIAGNOSIS — Z6838 Body mass index (BMI) 38.0-38.9, adult: Secondary | ICD-10-CM | POA: Diagnosis not present

## 2017-04-12 DIAGNOSIS — E039 Hypothyroidism, unspecified: Secondary | ICD-10-CM

## 2017-04-12 NOTE — Patient Instructions (Addendum)
BEFORE YOU LEAVE: -follow up:  1) lab visit for fasting labs 2) CPE in 1 year with pap  We have ordered labs or studies at this visit. It can take up to 1-2 weeks for results and processing. IF results require follow up or explanation, we will call you with instructions. Clinically stable results will be released to your [facility]. If you have not heard from [place] or cannot find your results in [facility] in 2 weeks please contact our office at [phone].  If you are not yet signed up for [facility], please consider signing up.   We recommend the following healthy lifestyle for LIFE: 1) Small portions. But, make sure to get regular (at least 3 per day), healthy meals and small healthy snacks if needed.  2) Eat a healthy clean diet.   TRY TO EAT: -at least 5-7 servings of low sugar, colorful, and nutrient rich vegetables per day (not corn, potatoes or bananas.) -berries are the best choice if you wish to eat fruit (only eat small amounts if trying to reduce weight)  -lean meets (fish, white meat of chicken or [place]) -vegan proteins for some meals - beans or tofu, whole grains, nuts and seeds -Replace bad fats with good fats - good fats include: fish, nuts and seeds, canola oil, olive oil -small amounts of low fat or non fat dairy -small amounts of100 % whole grains - check the lables -drink plenty of water  AVOID: -SUGAR, sweets, anything with added sugar, corn syrup or sweeteners - must read labels as even foods advertised as "healthy" often are loaded with sugar -if you must have a sweetener, small amounts of stevia may be best -sweetened beverages and artificially sweetened beverages -simple starches (rice, bread, potatoes, pasta, chips, etc - small amounts of 100% whole grains are ok) -red meat, pork, butter -fried foods, fast food, processed food, excessive dairy, eggs and coconut.  3)Get at least 150 minutes of sweaty aerobic exercise per week.  4)Reduce stress - consider counseling,  meditation and relaxation to balance other aspects of your life.    Preventive Care 18-39 Years, Female Preventive care refers to lifestyle choices and visits with your health care provider that can promote health and wellness. What does preventive care include?  A yearly physical exam. This is also called an annual well check.  Dental exams once or twice a year.  Routine eye exams. Ask your health care provider how often you should have your eyes checked.  Personal lifestyle choices, including: ? Daily care of your teeth and gums. ? Regular physical activity. ? Eating a healthy diet. ? Avoiding tobacco and drug use. ? Limiting alcohol use. ? Practicing safe sex. ? Taking vitamin and mineral supplements as recommended by your health care provider. What happens during an annual well check? The services and screenings done by your health care provider during your annual well check will depend on your age, overall health, lifestyle risk factors, and family history of disease. Counseling Your health care provider may ask you questions about your:  Alcohol use.  Tobacco use.  Drug use.  Emotional well-being.  Home and relationship well-being.  Sexual activity.  Eating habits.  Work and work Statistician.  Method of birth control.  Menstrual cycle.  Pregnancy history.  Screening You may have the following tests or measurements:  Height, weight, and BMI.  Diabetes screening. This is done by checking your blood sugar (glucose) after you have not eaten for a while (fasting).  Blood pressure.  Lipid and cholesterol levels. These may be checked every 5 years starting at age 78.  Skin check.  Hepatitis C blood test.  Hepatitis B blood test.  Sexually transmitted disease (STD) testing.  BRCA-related cancer screening. This may be done if you have a family history of breast, ovarian, tubal, or peritoneal cancers.  Pelvic exam and Pap test. This may be done every  3 years starting at age 43. Starting at age 15, this may be done every 5 years if you have a Pap test in combination with an HPV test.  Discuss your test results, treatment options, and if necessary, the need for more tests with your health care provider. Vaccines Your health care provider may recommend certain vaccines, such as:  Influenza vaccine. This is recommended every year.  Tetanus, diphtheria, and acellular pertussis (Tdap, Td) vaccine. You may need a Td booster every 10 years.  Varicella vaccine. You may need this if you have not been vaccinated.  HPV vaccine. If you are 31 or younger, you may need three doses over 6 months.  Measles, mumps, and rubella (MMR) vaccine. You may need at least one dose of MMR. You may also need a second dose.  Pneumococcal 13-valent conjugate (PCV13) vaccine. You may need this if you have certain conditions and were not previously vaccinated.  Pneumococcal polysaccharide (PPSV23) vaccine. You may need one or two doses if you smoke cigarettes or if you have certain conditions.  Meningococcal vaccine. One dose is recommended if you are age 73-21 years and a first-year college student living in a residence hall, or if you have one of several medical conditions. You may also need additional booster doses.  Hepatitis A vaccine. You may need this if you have certain conditions or if you travel or work in places where you may be exposed to hepatitis A.  Hepatitis B vaccine. You may need this if you have certain conditions or if you travel or work in places where you may be exposed to hepatitis B.  Haemophilus influenzae type b (Hib) vaccine. You may need this if you have certain risk factors.  Talk to your health care provider about which screenings and vaccines you need and how often you need them. This information is not intended to replace advice given to you by your health care provider. Make sure you discuss any questions you have with your health  care provider. Document Released: 05/09/2001 Document Revised: 12/01/2015 Document Reviewed: 01/12/2015 Elsevier Interactive Patient Education  [employer].

## 2017-04-12 NOTE — Progress Notes (Signed)
HPI:  Here for CPE:  -Concerns and/or follow up today:   Has not had a follow-up with physical in a number of years.  She has a history of hypothyroidism that was mild and she self weaned off her medications.  She also has a history of generalized anxiety and depression and used to see psychiatry.  Currently is not on any medications.  Reports is doing well for the most part.  She sees dermatologist for recurrent skin lesions on her breasts.  She has had biopsies of these lesions and they are some type of keloid she reports.  She plans to follow-up with dermatology soon.  She tries to eat healthy.  No regular exercise recently.  She wants to check her thyroid as she has had some dry skin and had some weight loss with her pregnancy and after.  -Diet: variety of foods, balance and well rounded, larger portion sizes -Exercise: no regular exercise -Taking folic acid, vitamin D or calcium: no -Diabetes and Dyslipidemia Screening: Not fasting, she prefers to return for fasting labs -Vaccines: see vaccine section EPIC -pap history: She had a negative Pap smear with her gynecologist, Allyn Kenner in 10/2015 -FDLMP: see nursing notes -sexual activity: yes, female partner, no new partners -wants STI testing (Hep C if born 57-65): no -FH breast, colon or ovarian ca: see FH Last mammogram: Not applicable Last colon cancer screening: Not applicable Breast Ca Risk Assessment: see family history and pt history DEXA (>/= 65): Not applicable  -Alcohol, Tobacco, drug use: see social history  Review of Systems - no fevers, unintentional weight loss, vision loss, hearing loss, chest pain, sob, hemoptysis, melena, hematochezia, hematuria, genital discharge, changing or concerning skin lesions, bleeding, bruising, loc, thoughts of self harm or SI  Past Medical History:  Diagnosis Date  . Anxiety   . Depression   . Hypothyroidism     Past Surgical History:  Procedure Laterality Date  . NO PAST  SURGERIES      Family History  Problem Relation Age of Onset  . Alcohol abuse Neg Hx   . Arthritis Neg Hx   . Asthma Neg Hx   . Birth defects Neg Hx   . Cancer Neg Hx   . COPD Neg Hx   . Depression Neg Hx   . Diabetes Neg Hx   . Drug abuse Neg Hx   . Early death Neg Hx   . Hearing loss Neg Hx   . Heart disease Neg Hx   . Hyperlipidemia Neg Hx   . Hypertension Neg Hx   . Kidney disease Neg Hx   . Learning disabilities Neg Hx   . Mental illness Neg Hx   . Mental retardation Neg Hx   . Miscarriages / Stillbirths Neg Hx   . Stroke Neg Hx   . Vision loss Neg Hx   . Varicose Veins Neg Hx     Social History   Socioeconomic History  . Marital status: Married    Spouse name: None  . Number of children: None  . Years of education: None  . Highest education level: None  Social Needs  . Financial resource strain: None  . Food insecurity - worry: None  . Food insecurity - inability: None  . Transportation needs - medical: None  . Transportation needs - non-medical: None  Occupational History  . None  Tobacco Use  . Smoking status: Never Smoker  . Smokeless tobacco: Never Used  Substance and Sexual Activity  . Alcohol use:  No  . Drug use: No  . Sexual activity: Yes    Birth control/protection: None  Other Topics Concern  . 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  FLUoxetine (PROZAC) 40 MG capsule, Take 40 mg by mouth daily., Disp: , Rfl:  .  Prenatal Vit-Fe Fumarate-FA (PRENATAL VITAMIN PO), Take by mouth., Disp: , Rfl:   EXAM:  Vitals:   04/12/17 1328  BP: 100/76  Pulse: 82  Temp: 98 F (36.7  C)  SpO2: 98%  Body mass index is 38.11 kg/m.   GENERAL: vitals reviewed and listed below, alert, oriented, appears well hydrated and in no acute distress  HEENT: head atraumatic, PERRLA, normal appearance of eyes, ears, nose and mouth. moist mucus membranes.  NECK: supple, no masses or lymphadenopathy  LUNGS: clear to auscultation bilaterally, no rales, rhonchi or wheeze  CV: HRRR, no peripheral edema or cyanosis, normal pedal pulses  ABDOMEN: bowel sounds normal, soft, non tender to palpation, no masses, no rebound or guarding  GU/BREAST: Declined  SKIN: no rash or abnormal lesions  MS: normal gait, moves all extremities normally  NEURO: normal gait, speech and thought processing grossly intact, muscle tone grossly intact throughout  PSYCH: normal affect, pleasant and cooperative  ASSESSMENT AND PLAN:  Discussed the following assessment and plan:  PREVENTIVE EXAM: -Discussed and advised all [place] preventive services health task force level A and B recommendations for age, sex and risks. -Advised at least 150 minutes of exercise per week and a healthy diet with avoidance of (less then 1 serving per week) processed foods, white starches, red meat, fast foods and sweets and consisting of: * 5-9 servings of fresh fruits and vegetables (not corn or potatoes) *nuts and seeds, beans *olives and olive oil *lean meats such as fish and white chicken  *whole grains -labs, studies and vaccines per orders this encounter  1. Encounter for preventative adult health care examination - Lipid panel - Hemoglobin A1c  2. Hypothyroidism, unspecified type - TSH  3. BMI 38.0-38.9,adult -wt reduction, lifestyle recs advised   Patient advised to return to clinic immediately if symptoms worsen or persist or new concerns.  Patient Instructions  BEFORE YOU LEAVE: -follow up:  1) lab visit for fasting labs 2) CPE in 1 year with pap  We have ordered labs or studies at this visit. It  can take up to 1-2 weeks for results and processing. IF results require follow up or explanation, we will call you with instructions. Clinically stable results will be released to your [facility]. If you have not heard from [place] or cannot find your results in [facility] in 2 weeks please contact our office at [phone].  If you are not yet signed up for [facility], please consider signing up.   We recommend the following healthy lifestyle for LIFE: 1) Small portions. But, make sure to get regular (at least 3 per day), healthy meals and small healthy snacks if needed.  2) Eat a healthy clean diet.   TRY  TO EAT: -at least 5-7 servings of low sugar, colorful, and nutrient rich vegetables per day (not corn, potatoes or bananas.) -berries are the best choice if you wish to eat fruit (only eat small amounts if trying to reduce weight)  -lean meets (fish, white meat of chicken or [place]) -vegan proteins for some meals - beans or tofu, whole grains, nuts and seeds -Replace bad fats with good fats - good fats include: fish, nuts and seeds, canola oil, olive oil -small amounts of low fat or non fat dairy -small amounts of100 % whole grains - check the lables -drink plenty of water  AVOID: -SUGAR, sweets, anything with added sugar, corn syrup or sweeteners - must read labels as even foods advertised as "healthy" often are loaded with sugar -if you must have a sweetener, small amounts of stevia may be best -sweetened beverages and artificially sweetened beverages -simple starches (rice, bread, potatoes, pasta, chips, etc - small amounts of 100% whole grains are ok) -red meat, pork, butter -fried foods, fast food, processed food, excessive dairy, eggs and coconut.  3)Get at least 150 minutes of sweaty aerobic exercise per week.  4)Reduce stress - consider counseling, meditation and relaxation to balance other aspects of your life.    Preventive Care 18-39 Years, Female Preventive care refers to  lifestyle choices and visits with your health care provider that can promote health and wellness. What does preventive care include?  A yearly physical exam. This is also called an annual well check.  Dental exams once or twice a year.  Routine eye exams. Ask your health care provider how often you should have your eyes checked.  Personal lifestyle choices, including: ? Daily care of your teeth and gums. ? Regular physical activity. ? Eating a healthy diet. ? Avoiding tobacco and drug use. ? Limiting alcohol use. ? Practicing safe sex. ? Taking vitamin and mineral supplements as recommended by your health care provider. What happens during an annual well check? The services and screenings done by your health care provider during your annual well check will depend on your age, overall health, lifestyle risk factors, and family history of disease. Counseling Your health care provider may ask you questions about your:  Alcohol use.  Tobacco use.  Drug use.  Emotional well-being.  Home and relationship well-being.  Sexual activity.  Eating habits.  Work and work Statistician.  Method of birth control.  Menstrual cycle.  Pregnancy history.  Screening You may have the following tests or measurements:  Height, weight, and BMI.  Diabetes screening. This is done by checking your blood sugar (glucose) after you have not eaten for a while (fasting).  Blood pressure.  Lipid and cholesterol levels. These may be checked every 5 years starting at age 14.  Skin check.  Hepatitis C blood test.  Hepatitis B blood test.  Sexually transmitted disease (STD) testing.  BRCA-related cancer screening. This may be done if you have a family history of breast, ovarian, tubal, or peritoneal cancers.  Pelvic exam and Pap test. This may be done every 3 years starting at age 60. Starting at age 36, this may be done every 5 years if you have a Pap test in combination with an HPV  test.  Discuss your test results, treatment options, and if necessary, the need for more tests with your health care provider. Vaccines Your health care provider may recommend certain vaccines, such as:  Influenza vaccine. This is recommended every year.  Tetanus, diphtheria, and acellular pertussis (  Tdap, Td) vaccine. You may need a Td booster every 10 years.  Varicella vaccine. You may need this if you have not been vaccinated.  HPV vaccine. If you are 88 or younger, you may need three doses over 6 months.  Measles, mumps, and rubella (MMR) vaccine. You may need at least one dose of MMR. You may also need a second dose.  Pneumococcal 13-valent conjugate (PCV13) vaccine. You may need this if you have certain conditions and were not previously vaccinated.  Pneumococcal polysaccharide (PPSV23) vaccine. You may need one or two doses if you smoke cigarettes or if you have certain conditions.  Meningococcal vaccine. One dose is recommended if you are age 74-21 years and a first-year college student living in a residence hall, or if you have one of several medical conditions. You may also need additional booster doses.  Hepatitis A vaccine. You may need this if you have certain conditions or if you travel or work in places where you may be exposed to hepatitis A.  Hepatitis B vaccine. You may need this if you have certain conditions or if you travel or work in places where you may be exposed to hepatitis B.  Haemophilus influenzae type b (Hib) vaccine. You may need this if you have certain risk factors.  Talk to your health care provider about which screenings and vaccines you need and how often you need them. This information is not intended to replace advice given to you by your health care provider. Make sure you discuss any questions you have with your health care provider. Document Released: 05/09/2001 Document Revised: 12/01/2015 Document Reviewed: 01/12/2015 Elsevier Interactive  Patient Education  2018 [employer].        No Follow-up on file.  Colin Benton R., DO

## 2017-04-13 LAB — LIPID PANEL
Cholesterol: 172 mg/dL (ref 0–200)
HDL: 41.7 mg/dL (ref >39.00)
LDL Cholesterol: 111 mg/dL — ABNORMAL HIGH (ref 0–99)
NonHDL: 130.1
Total CHOL/HDL Ratio: 4
Triglycerides: 98 mg/dL (ref 0.0–149.0)
VLDL: 19.6 mg/dL (ref 0.0–40.0)

## 2017-04-13 LAB — HEMOGLOBIN A1C: Hgb A1c MFr Bld: 5.1 % (ref 4.6–6.5)

## 2017-04-13 LAB — TSH: TSH: 10.8 u[IU]/mL — ABNORMAL HIGH (ref 0.35–4.50)

## 2017-04-16 ENCOUNTER — Other Ambulatory Visit: Payer: Self-pay | Admitting: *Deleted

## 2017-04-16 MED ORDER — LEVOTHYROXINE SODIUM 50 MCG PO TABS: 50.0000 ug | ORAL_TABLET | Freq: Every day | ORAL | 3 refills | Status: DC

## 2017-04-16 MED FILL — LEVOTHYROXINE 50 MCG TABLET: 50 | 30 days supply | Qty: 30 | Fill #0 | Status: TO

## 2017-05-20 ENCOUNTER — Ambulatory Visit: Payer: Self-pay | Admitting: Emergency Medicine

## 2017-05-20 VITALS — BP 95/75 | HR 112 | Temp 97.9°F | Resp 16 | Wt 227.4 lb

## 2017-05-20 DIAGNOSIS — R6889 Other general symptoms and signs: Secondary | ICD-10-CM

## 2017-05-20 LAB — POCT INFLUENZA A/B
Influenza A, POC: NEGATIVE
Influenza A, POC: NEGATIVE
Influenza B, POC: NEGATIVE
Influenza B, POC: NEGATIVE

## 2017-05-20 NOTE — Patient Instructions (Signed)

## 2017-05-20 NOTE — Progress Notes (Signed)
Subjective:     Hannah Douglas is a 32 y.o. female who presents for evaluation of influenza like symptoms. Symptoms include chills, headache, myalgias, productive cough and fever and have been present for 1 day. She has tried to alleviate the symptoms with acetaminophen with no relief. High risk factors for influenza complications: none.  The following portions of the patient's history were reviewed and updated as appropriate: allergies and current medications.  Review of Systems Pertinent items are noted in HPI.     Objective:   Vitals:   05/20/17 1543  BP: 95/75  Pulse: (!) 112  Resp: 16  Temp: 97.9 F (36.6 C)  SpO2: 97%   Physical Exam  Constitutional: She appears well-developed and well-nourished. She does not appear ill.  HENT:  Head: Normocephalic.  Right Ear: External ear normal.  Left Ear: External ear normal.  Mouth/Throat: Oropharynx is clear and moist.  Eyes: Conjunctivae are normal.  Neck: Normal range of motion.  Cardiovascular: Regular rhythm. Tachycardia present.  Pulmonary/Chest: Effort normal and breath sounds normal.  Abdominal: Soft.  Neurological: She is alert.  Skin: Skin is warm. Capillary refill takes less than 2 seconds. She is diaphoretic.  Psychiatric: She has a normal mood and affect.  Vitals reviewed.    Assessment:    URI    Plan:    Supportive care with appropriate antipyretics and fluids. Follow up in 1 week or as needed. Tylenol or motrin as needed

## 2017-05-23 ENCOUNTER — Telehealth: Payer: Self-pay

## 2017-05-28 ENCOUNTER — Encounter: Payer: Self-pay | Admitting: Family Medicine

## 2017-06-27 ENCOUNTER — Ambulatory Visit: Payer: Self-pay | Admitting: Nurse Practitioner

## 2017-06-27 VITALS — BP 105/75 | HR 86 | Temp 98.0°F | Resp 16 | Wt 231.4 lb

## 2017-06-27 DIAGNOSIS — J209 Acute bronchitis, unspecified: Secondary | ICD-10-CM

## 2017-06-27 MED ORDER — PREDNISONE 10 MG (21) PO TBPK: ORAL_TABLET | ORAL | 0 refills | Status: AC

## 2017-06-27 MED ORDER — AZITHROMYCIN 250 MG PO TABS: ORAL_TABLET | ORAL | 0 refills | Status: AC

## 2017-06-27 MED ORDER — AZITHROMYCIN 250 MG PO TABS: ORAL_TABLET | ORAL | 0 refills | Status: DC

## 2017-06-27 MED ORDER — ALBUTEROL SULFATE HFA 108 (90 BASE) MCG/ACT IN AERS: 2.0000 | INHALATION_SPRAY | Freq: Four times a day (QID) | RESPIRATORY_TRACT | 0 refills | Status: DC | PRN

## 2017-06-27 MED ORDER — BENZONATATE 100 MG PO CAPS: 100.0000 mg | ORAL_CAPSULE | Freq: Three times a day (TID) | ORAL | 0 refills | Status: AC | PRN

## 2017-06-27 NOTE — Progress Notes (Signed)
Subjective:     Hannah Douglas is a 32 y.o. female here for evaluation of a cough. Onset of symptoms was over a  month ago. Patient was seen for influenza-like symptoms in February, flu test was negative Symptoms seemed to be improving but have gradually worsened over the past week or so since that time. The cough is productive of yellow sputum and is aggravated by nothing. Associated symptoms include: shortness of breath, sputum production and fatigue. Patient does not have a history of asthma. Patient does not have a history of environmental allergens. Patient has not traveled recently. Patient does not have a history of smoking.   The following portions of the patient's history were reviewed and updated as appropriate: allergies, current medications and past medical history.  Review of Systems Constitutional: positive for fatigue, negative for anorexia, chills, fevers, malaise and sweats Eyes: negative Ears, nose, mouth, throat, and face: negative Respiratory: positive for cough, dyspnea on exertion, sputum and wheezing, negative for asthma, chronic bronchitis, hemoptysis and stridor Cardiovascular: negative Gastrointestinal: negative Neurological: positive for headaches, negative for coordination problems, dizziness, paresthesia, speech problems, tremors, vertigo and weakness Allergic/Immunologic: negative    Objective:    BP 105/75 (BP Location: Right Arm, Patient Position: Sitting, Cuff Size: Normal)   Pulse 86   Temp 98 F (36.7 C) (Oral)   Resp 16   Wt 231 lb 6.4 oz (105 kg)   SpO2 97%   BMI 38.51 kg/m  General appearance: alert, cooperative, fatigued and no distress Head: Normocephalic, without obvious abnormality, atraumatic Eyes: conjunctivae/corneas clear. PERRL, EOM's intact. Fundi benign. Ears: normal TM's and external ear canals both ears Nose: no discharge, turbinates swollen, inflamed, no sinus tenderness Throat: lips, mucosa, and tongue normal; teeth and gums  normal Lungs: clear to auscultation bilaterally Heart: regular rate and rhythm, S1, S2 normal, no murmur, click, rub or gallop Abdomen: soft, non-tender; bowel sounds normal; no masses,  no organomegaly Pulses: 2+ and symmetric Skin: Skin color, texture, turgor normal. No rashes or lesions Lymph nodes: cervical and submandibular nodes normal Neurologic: Grossly normal    Assessment:    Acute Bronchitis    Plan:    Antibiotics per medication orders. Antitussives per medication orders. Avoid exposure to tobacco smoke and fumes. B-agonist inhaler. Call if shortness of breath worsens, blood in sputum, change in character of cough, development of fever or chills, inability to maintain nutrition and hydration. Avoid exposure to tobacco smoke and fumes. Increase fluids, use humidifier at home.  Sleep elevated on pillows at night.  Use Albuterol inhaler if develop chest tightness, wheezing or SOB. Instructions provided for medication administration.  Patient verbalizes understanding and has no questions at time of discharge. Meds ordered this encounter  Medications  . DISCONTD: azithromycin (ZITHROMAX) 250 MG tablet    Sig: Take as directed.    Dispense:  6 tablet    Refill:  0    Order Specific Question:   Supervising Provider    Answer:   Stacie GlazeJENKINS, JOHN E [[id number]]  . predniSONE (STERAPRED UNI-PAK 21 TAB) 10 MG (21) TBPK tablet    Sig: Take as directed.    Dispense:  21 tablet    Refill:  0    Order Specific Question:   Supervising Provider    Answer:   Stacie GlazeJENKINS, JOHN E [[id number]]  . benzonatate (TESSALON) 100 MG capsule    Sig: Take 1 capsule (100 mg total) by mouth 3 (three) times daily as needed for up to 10 days for cough.  Dispense:  30 capsule    Refill:  0    Order Specific Question:   Supervising Provider    Answer:   Stacie Glaze [[id number]]  . albuterol (PROVENTIL HFA) 108 (90 Base) MCG/ACT inhaler    Sig: Inhale 2 puffs into the lungs every 6 (six) hours as needed for up to 10  days for wheezing or shortness of breath (wheezing, cough or shortness of breath).    Dispense:  1 Inhaler    Refill:  0    Order Specific Question:   Supervising Provider    Answer:   Stacie Glaze [[id number]]  . azithromycin (ZITHROMAX) 250 MG tablet    Sig: Take as directed.    Dispense:  6 tablet    Refill:  0    Order Specific Question:   Supervising Provider    Answer:   Stacie Glaze [phone]

## 2017-06-27 NOTE — Patient Instructions (Signed)

## 2017-07-06 ENCOUNTER — Telehealth: Payer: Self-pay

## 2017-07-06 NOTE — Telephone Encounter (Signed)
Called to follow up with pt and pt states she is feeling much better.

## 2017-07-22 NOTE — Progress Notes (Signed)
HPI:  Using dictation device. Unfortunately this device frequently misinterprets words/phrases.  Follow up: CPE 04/12/17  Hypothyroidism: -meds: levothyroxine  Anxiety and Depression: -used to see psychiatrist, Dr. Evelene Croon - she is retiring so pt would like [place] to rx her medication -meds: prozac 40 mg -used to take xanax for panic - did not like it -did not bond well with prior counselor -now reports doing well, occ increased anxiety -denies severe symptoms, thoughts of harm, SI, panic, hallucinations, etc  Hyperlipidemia, mild/Obesity: -lifestyle changes advised -wt 229 (03/2017)  ROS: See pertinent positives and negatives per HPI.  Past Medical History:  Diagnosis Date  . Anxiety   . Depression   . Hypothyroidism     Past Surgical History:  Procedure Laterality Date  . NO PAST SURGERIES      Family History  Problem Relation Age of Onset  . Alcohol abuse Neg Hx   . Arthritis Neg Hx   . Asthma Neg Hx   . Birth defects Neg Hx   . Cancer Neg Hx   . COPD Neg Hx   . Depression Neg Hx   . Diabetes Neg Hx   . Drug abuse Neg Hx   . Early death Neg Hx   . Hearing loss Neg Hx   . Heart disease Neg Hx   . Hyperlipidemia Neg Hx   . Hypertension Neg Hx   . Kidney disease Neg Hx   . Learning disabilities Neg Hx   . Mental illness Neg Hx   . Mental retardation Neg Hx   . Miscarriages / Stillbirths Neg Hx   . Stroke Neg Hx   . Vision loss Neg Hx   . Varicose Veins Neg Hx     SOCIAL HX: 3 children   Current Outpatient Medications:  .  FLUoxetine (PROZAC) 40 MG capsule, Take 1 capsule (40 mg total) by mouth daily., Disp: 30 capsule, Rfl: 3 .  levothyroxine (SYNTHROID, LEVOTHROID) 50 MCG tablet, Take 1 tablet (50 mcg total) by mouth daily., Disp: 30 tablet, Rfl: 3 .  Multiple Vitamin (MULTIVITAMIN) tablet, Take 1 tablet by mouth daily., Disp: , Rfl:  .  Omega-3 Fatty Acids (FISH OIL) 1000 MG CAPS, Take by mouth., Disp: , Rfl:   EXAM:  Vitals:   07/23/17 0858   BP: 100/78  Pulse: 88  Temp: 98.1 F (36.7 C)    Body mass index is 38.71 kg/m.  GENERAL: vitals reviewed and listed above, alert, oriented, appears well hydrated and in no acute distress  HEENT: atraumatic, conjunttiva clear, no obvious abnormalities on inspection of external nose and ears  NECK: no obvious masses on inspection  LUNGS: clear to auscultation bilaterally, no wheezes, rales or rhonchi, good air movement  CV: HRRR, no peripheral edema  MS: moves all extremities without noticeable abnormality  PSYCH: pleasant and cooperative, no obvious depression or anxiety  ASSESSMENT AND PLAN:  Discussed the following assessment and plan:  Hypothyroidism, unspecified type - Plan: TSH  Hyperlipidemia, unspecified hyperlipidemia type  Obesity (BMI 35.0-39.9 without comorbidity)  Anxiety and depression  -recheck TSH -lifestyle recommendations for healthy diet and regular aerobic exercise -advised CBT -refills provided for pozac -follow up 3 - 4 months -f/u sooner as needed Patient Instructions  BEFORE YOU LEAVE: -lab -follow up: 3-4 months  Consider cognitive behavioral therapy. Please call to set up visit.  Recommend a healthy diet and regular aerobic exercise with a goal of 10-20 lb wt reduction over the next 3-6 months. See below.   We  recommend the following healthy lifestyle for LIFE: 1) Small portions. But, make sure to get regular (at least 3 per day), healthy meals and small healthy snacks if needed.  2) Eat a healthy clean diet.   TRY TO EAT: -at least 5-7 servings of low sugar, colorful, and nutrient rich vegetables per day (not corn, potatoes or bananas.) -berries are the best choice if you wish to eat fruit (only eat small amounts if trying to reduce weight)  -lean meets (fish, white meat of chicken or [place]) -vegan proteins for some meals - beans or tofu, whole grains, nuts and seeds -Replace bad fats with good fats - good fats include: fish,  nuts and seeds, canola oil, olive oil -small amounts of low fat or non fat dairy -small amounts of100 % whole grains - check the lables -drink plenty of water  AVOID: -SUGAR, sweets, anything with added sugar, corn syrup or sweeteners - must read labels as even foods advertised as "healthy" often are loaded with sugar -if you must have a sweetener, small amounts of stevia may be best -sweetened beverages and artificially sweetened beverages -simple starches (rice, bread, potatoes, pasta, chips, etc - small amounts of 100% whole grains are ok) -red meat, pork, butter -fried foods, fast food, processed food, excessive dairy, eggs and coconut.  3)Get at least 150 minutes of sweaty aerobic exercise per week.  4)Reduce stress - consider counseling, meditation and relaxation to balance other aspects of your life.    Terressa Koyanagi, DO

## 2017-07-23 ENCOUNTER — Ambulatory Visit (INDEPENDENT_AMBULATORY_CARE_PROVIDER_SITE_OTHER): Payer: Self-pay | Admitting: Family Medicine

## 2017-07-23 ENCOUNTER — Other Ambulatory Visit: Payer: Self-pay | Admitting: Family Medicine

## 2017-07-23 ENCOUNTER — Encounter: Payer: Self-pay | Admitting: Family Medicine

## 2017-07-23 VITALS — BP 100/78 | HR 88 | Temp 98.1°F | Ht 65.0 in | Wt 232.6 lb

## 2017-07-23 DIAGNOSIS — E039 Hypothyroidism, unspecified: Secondary | ICD-10-CM

## 2017-07-23 DIAGNOSIS — F329 Major depressive disorder, single episode, unspecified: Secondary | ICD-10-CM

## 2017-07-23 DIAGNOSIS — F419 Anxiety disorder, unspecified: Secondary | ICD-10-CM

## 2017-07-23 DIAGNOSIS — E785 Hyperlipidemia, unspecified: Secondary | ICD-10-CM

## 2017-07-23 DIAGNOSIS — F32A Depression, unspecified: Secondary | ICD-10-CM

## 2017-07-23 DIAGNOSIS — E669 Obesity, unspecified: Secondary | ICD-10-CM

## 2017-07-23 LAB — TSH: TSH: 3.73 u[IU]/mL (ref 0.35–4.50)

## 2017-07-23 MED ORDER — FLUOXETINE HCL 40 MG PO CAPS: 40.0000 mg | ORAL_CAPSULE | Freq: Every day | ORAL | 3 refills | Status: DC

## 2017-07-23 MED ORDER — LEVOTHYROXINE SODIUM 50 MCG PO TABS: 50.0000 ug | ORAL_TABLET | Freq: Every day | ORAL | 3 refills | Status: DC

## 2017-07-23 NOTE — Patient Instructions (Signed)
BEFORE YOU LEAVE: -lab -follow up: 3-4 months  Consider cognitive behavioral therapy. Please call to set up visit.  Recommend a healthy diet and regular aerobic exercise with a goal of 10-20 lb wt reduction over the next 3-6 months. See below.   We recommend the following healthy lifestyle for LIFE: 1) Small portions. But, make sure to get regular (at least 3 per day), healthy meals and small healthy snacks if needed.  2) Eat a healthy clean diet.   TRY TO EAT: -at least 5-7 servings of low sugar, colorful, and nutrient rich vegetables per day (not corn, potatoes or bananas.) -berries are the best choice if you wish to eat fruit (only eat small amounts if trying to reduce weight)  -lean meets (fish, white meat of chicken or [place]) -vegan proteins for some meals - beans or tofu, whole grains, nuts and seeds -Replace bad fats with good fats - good fats include: fish, nuts and seeds, canola oil, olive oil -small amounts of low fat or non fat dairy -small amounts of100 % whole grains - check the lables -drink plenty of water  AVOID: -SUGAR, sweets, anything with added sugar, corn syrup or sweeteners - must read labels as even foods advertised as "healthy" often are loaded with sugar -if you must have a sweetener, small amounts of stevia may be best -sweetened beverages and artificially sweetened beverages -simple starches (rice, bread, potatoes, pasta, chips, etc - small amounts of 100% whole grains are ok) -red meat, pork, butter -fried foods, fast food, processed food, excessive dairy, eggs and coconut.  3)Get at least 150 minutes of sweaty aerobic exercise per week.  4)Reduce stress - consider counseling, meditation and relaxation to balance other aspects of your life.

## 2017-10-10 ENCOUNTER — Encounter: Payer: Self-pay | Admitting: Family Medicine

## 2017-10-11 MED ORDER — FLUOXETINE HCL 40 MG PO CAPS: 40.0000 mg | ORAL_CAPSULE | Freq: Every day | ORAL | 3 refills | Status: DC

## 2017-12-04 ENCOUNTER — Other Ambulatory Visit: Payer: Self-pay | Admitting: Family Medicine

## 2018-04-14 NOTE — Progress Notes (Deleted)
HPI:  Using dictation device. Unfortunately this device frequently misinterprets words/phrases.  Here for CPE: Sees dermatologist for skin exams. Sees gynecologist, Philip Aspen for pap and breast exams. Due for flu shot and labs  -Concerns and/or follow up today: none ***  Chronic medical problems summarized below were reviewed for changes.***.   Hypothyroidism: -meds: levothyroxine  Anxiety and Depression: -used to see psychiatrist, Dr. Evelene Croon - she is retiring so pt would like [place] to rx her medication -meds: prozac 40 mg -used to take xanax for panic - did not like it -did not bond well with prior counselor -now reports doing well, occ increased anxiety -denies severe symptoms, thoughts of harm, SI, panic, hallucinations, etc  Hyperlipidemia, mild/Obesity: -lifestyle changes advised -wt 229 (03/2017)  -Diet: variety of foods, balance and well rounded, larger portion sizes -Exercise: no regular exercise -Taking folic acid, vitamin D or calcium: no -Diabetes and Dyslipidemia Screening: *** -Vaccines: see vaccine section EPIC -pap history: sees gyn - pap done 10/2015 -FDLMP: see nursing notes -sexual activity: sees gyn -wants STI testing (Hep C if born [birth year]): no -FH breast, colon or ovarian ca: see FH Last mammogram: see gyn, n/a Last colon cancer screening: n/a Breast Ca Risk Assessment: see family history and pt history DEXA (>/= 65): n/a  -Alcohol, Tobacco, drug use: see social history  Review of Systems - no fevers, unintentional weight loss, vision loss, hearing loss, chest pain, sob, hemoptysis, melena, hematochezia, hematuria, genital discharge, changing or concerning skin lesions, bleeding, bruising, loc, thoughts of self harm or SI  Past Medical History:  Diagnosis Date  . Anxiety   . Depression   . Hypothyroidism     Past Surgical History:  Procedure Laterality Date  . NO PAST SURGERIES      Family History  Problem Relation Age of Onset   . Alcohol abuse Neg Hx   . Arthritis Neg Hx   . Asthma Neg Hx   . Birth defects Neg Hx   . Cancer Neg Hx   . COPD Neg Hx   . Depression Neg Hx   . Diabetes Neg Hx   . Drug abuse Neg Hx   . Early death Neg Hx   . Hearing loss Neg Hx   . Heart disease Neg Hx   . Hyperlipidemia Neg Hx   . Hypertension Neg Hx   . Kidney disease Neg Hx   . Learning disabilities Neg Hx   . Mental illness Neg Hx   . Mental retardation Neg Hx   . Miscarriages / Stillbirths Neg Hx   . Stroke Neg Hx   . Vision loss Neg Hx   . Varicose Veins Neg Hx     Social History   Socioeconomic History  . Marital status: Married    Spouse name: Not on file  . Number of children: Not on file  . Years of education: Not on file  . Highest education level: Not on file  Occupational History  . Not on file  Social Needs  . Financial resource strain: Not on file  . Food insecurity:    Worry: Not on file    Inability: Not on file  . Transportation needs:    Medical: Not on file    Non-medical: Not on file  Tobacco Use  . Smoking status: Never Smoker  . Smokeless tobacco: Never Used  Substance and Sexual Activity  . Alcohol use: No  . Drug use: No  . Sexual activity: Yes    Birth  control/protection: None  Lifestyle  . Physical activity:    Days per week: Not on file    Minutes per session: Not on file  . Stress: Not on file  Relationships  . Social connections:    Talks on phone: Not on file    Gets together: Not on file    Attends religious service: Not on file    Active member of club or organization: Not on file    Attends meetings of clubs or organizations: Not on file    Relationship status: Not on file  Other Topics Concern  . 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  FLUoxetine (PROZAC) 40 MG capsule, TAKE 1 CAPSULE BY MOUTH EVERY DAY, Disp: 90 capsule, Rfl: 0 .  levothyroxine (SYNTHROID, LEVOTHROID) 50 MCG tablet, Take 1 tablet (50 mcg total) by mouth daily., Disp: 90 tablet, Rfl: 3 .  Multiple Vitamin (MULTIVITAMIN) tablet, Take 1 tablet by mouth daily., Disp: , Rfl:  .  Omega-3 Fatty Acids (FISH OIL) 1000 MG CAPS, Take by mouth., Disp: , Rfl:   EXAM:  There were no vitals filed for this visit.  GENERAL: vitals reviewed and listed below, alert, oriented, appears well hydrated and in no acute distress  HEENT: head atraumatic, PERRLA, normal appearance of eyes, ears, nose and mouth. moist mucus membranes.  NECK: supple, no masses or lymphadenopathy  LUNGS: clear to auscultation bilaterally, no rales, rhonchi or wheeze  CV: HRRR, no peripheral edema or cyanosis, normal pedal pulses  ABDOMEN: bowel sounds normal, soft, non tender to palpation, no masses, no rebound or guarding  GU/BREAST: ***  SKIN: no rash or abnormal lesions  MS: normal gait, moves all extremities normally  NEURO: normal gait, speech and thought processing grossly intact, muscle tone grossly intact throughout  PSYCH: normal affect, pleasant and cooperative  ASSESSMENT AND PLAN:  Discussed the following assessment and plan:  PREVENTIVE EXAM: -Discussed and advised all [place] preventive services health task force level A and B recommendations for age, sex and risks. -Advised at least 150 minutes of exercise per week and a healthy diet with avoidance of (less then 1 serving per week) processed foods, white starches, red meat, fast foods and sweets and consisting of: * 5-9 servings of fresh fruits and vegetables (not corn or potatoes) *nuts and seeds, beans *olives and olive oil *lean meats such as fish and white chicken   *whole grains -labs, studies and vaccines per orders this encounter  There are no diagnoses linked to this encounter. ***  Patient advised to return to clinic immediately if symptoms worsen or persist or new concerns.  There are no Patient Instructions on file for this visit.  No follow-ups on file.  Hannah KoyanagiHannah R Kim, DO

## 2018-04-15 ENCOUNTER — Encounter: Payer: Self-pay | Admitting: Family Medicine

## 2018-04-15 DIAGNOSIS — Z0289 Encounter for other administrative examinations: Secondary | ICD-10-CM

## 2018-06-12 ENCOUNTER — Encounter: Payer: Self-pay | Admitting: Family Medicine

## 2018-06-13 ENCOUNTER — Encounter: Payer: Self-pay | Admitting: Family Medicine

## 2018-07-02 ENCOUNTER — Encounter: Payer: Self-pay | Admitting: Family Medicine

## 2018-07-04 ENCOUNTER — Encounter: Payer: Self-pay | Admitting: Family Medicine

## 2018-07-04 ENCOUNTER — Ambulatory Visit (INDEPENDENT_AMBULATORY_CARE_PROVIDER_SITE_OTHER): Payer: Self-pay | Admitting: Family Medicine

## 2018-07-04 ENCOUNTER — Other Ambulatory Visit: Payer: Self-pay

## 2018-07-04 DIAGNOSIS — E669 Obesity, unspecified: Secondary | ICD-10-CM

## 2018-07-04 DIAGNOSIS — F419 Anxiety disorder, unspecified: Secondary | ICD-10-CM

## 2018-07-04 DIAGNOSIS — E039 Hypothyroidism, unspecified: Secondary | ICD-10-CM

## 2018-07-04 MED ORDER — LEVOTHYROXINE SODIUM 50 MCG PO TABS: 50.0000 ug | ORAL_TABLET | Freq: Every day | ORAL | 1 refills | Status: DC

## 2018-07-04 NOTE — Progress Notes (Signed)
Virtual Visit via Video Note  I connected with Hannah Douglas on 07/04/18 at  9:00 AM EDT by a video enabled telemedicine application and verified that I am speaking with the correct person using two identifiers.  Location patient: home Location provider:work or home office Persons participating in the virtual visit: patient, provider  I discussed the limitations of evaluation and management by telemedicine and the availability of in person appointments. The patient expressed understanding and agreed to proceed.   HPI:  Hannah Douglas is a pleasant 33 y.o. here for follow up. Chronic medical problems summarized below were reviewed for changes and stability and were updated as needed below. These issues and their treatment remain stable for the most part. Denies CP, SOB, DOE, treatment intolerance or new symptoms.  Hypothyroidism: -meds: levothyroxine -feels this has been stable, energy good, no weight gain  Anxiety and Depression: -used to see psychiatrist, Dr. Evelene Croon -doing ok, now off of SSRI, she is trying to do meditation and yoga -used to take xanax for panic - did not like it -did not bond well with prior counselor -now reports doing well, occasional anxiety/panic attacks -denies severe symptoms, thoughts of harm, SI, hallucinations, etc  Hyperlipidemia, mild/Obesity: -lifestyle changes advised -has bee really working on diet and exercise the last 3-4 months -wt 229 (03/2017) --> has lost 15 lbs since January  ROS: See pertinent positives and negatives per HPI.  Past Medical History:  Diagnosis Date  . Anxiety   . Depression   . Hypothyroidism     Past Surgical History:  Procedure Laterality Date  . NO PAST SURGERIES      Family History  Problem Relation Age of Onset  . Alcohol abuse Neg Hx   . Arthritis Neg Hx   . Asthma Neg Hx   . Birth defects Neg Hx   . Cancer Neg Hx   . COPD Neg Hx   . Depression Neg Hx   . Diabetes Neg Hx   . Drug abuse Neg Hx   .  Early death Neg Hx   . Hearing loss Neg Hx   . Heart disease Neg Hx   . Hyperlipidemia Neg Hx   . Hypertension Neg Hx   . Kidney disease Neg Hx   . Learning disabilities Neg Hx   . Mental illness Neg Hx   . Mental retardation Neg Hx   . Miscarriages / Stillbirths Neg Hx   . Stroke Neg Hx   . Vision loss Neg Hx   . Varicose Veins Neg Hx     SOCIAL HX: see hpi   Current Outpatient Medications:  .  levothyroxine (SYNTHROID, LEVOTHROID) 50 MCG tablet, Take 1 tablet (50 mcg total) by mouth daily., Disp: 90 tablet, Rfl: 3 .  Multiple Vitamin (MULTIVITAMIN) tablet, Take 1 tablet by mouth daily., Disp: , Rfl:  .  Omega-3 Fatty Acids (FISH OIL) 1000 MG CAPS, Take by mouth., Disp: , Rfl:   EXAM:  VITALS per patient if applicable: wt 215  GENERAL: alert, oriented, appears well and in no acute distress  HEENT: atraumatic, conjunttiva clear, no obvious abnormalities on inspection of external nose and ears  NECK: normal movements of the head and neck  LUNGS: on inspection no signs of respiratory distress, breathing rate appears normal, no obvious gross SOB, gasping or wheezing  CV: no obvious cyanosis  MS: moves all visible extremities without noticeable abnormality  PSYCH/NEURO: pleasant and cooperative, no obvious depression or anxiety, speech and thought processing grossly intact  ASSESSMENT AND PLAN:  Discussed the following assessment and plan:  Hypothyroidism, unspecified type  Obesity without serious comorbidity, unspecified classification, unspecified obesity type  Anxiety  Encouraged and congratulated on lifestyle changes.  Counseled on options for diet and exercise and for anxiety treatment.  Will need labs. She has opted to postpone these for now in light of the COVID19 pandemic.   I discussed the assessment and treatment plan with the patient. The patient was provided an opportunity to ask questions and all were answered. The patient agreed with the plan and  demonstrated an understanding of the instructions.  She wants to check bios to decide who to see for her next visit and will let us know.    The patient was advised to call back or seek an in-person evaluation if the symptoms worsen or if the condition fails to improve as anticipated.    Terressa KoyanagiHannah R Yamilka Lopiccolo, DO

## 2018-12-12 ENCOUNTER — Encounter: Payer: Self-pay | Admitting: Family Medicine

## 2018-12-12 ENCOUNTER — Telehealth (INDEPENDENT_AMBULATORY_CARE_PROVIDER_SITE_OTHER): Payer: Self-pay | Admitting: Family Medicine

## 2018-12-12 ENCOUNTER — Other Ambulatory Visit: Payer: Self-pay | Admitting: Family Medicine

## 2018-12-12 ENCOUNTER — Other Ambulatory Visit: Payer: Self-pay

## 2018-12-12 DIAGNOSIS — R079 Chest pain, unspecified: Secondary | ICD-10-CM

## 2018-12-12 DIAGNOSIS — R0602 Shortness of breath: Secondary | ICD-10-CM

## 2018-12-12 DIAGNOSIS — F419 Anxiety disorder, unspecified: Secondary | ICD-10-CM

## 2018-12-12 DIAGNOSIS — E039 Hypothyroidism, unspecified: Secondary | ICD-10-CM

## 2018-12-12 DIAGNOSIS — F410 Panic disorder [episodic paroxysmal anxiety] without agoraphobia: Secondary | ICD-10-CM

## 2018-12-12 MED ORDER — LORAZEPAM 0.5 MG PO TABS: 0.5000 mg | ORAL_TABLET | Freq: Three times a day (TID) | ORAL | 0 refills | Status: DC | PRN

## 2018-12-12 MED ORDER — PAROXETINE HCL 20 MG PO TABS: 20.0000 mg | ORAL_TABLET | Freq: Every day | ORAL | 1 refills | Status: DC

## 2018-12-12 NOTE — Patient Instructions (Addendum)
Go to the urgent care right away for evaluation of your symptoms. Please send the notes from this visit via [place]. Seek emergency care if worsening, struggling to breath or you are having severe symptoms  at any point.  Please schedule a visit with [facility] [phone]  I sent the Paxil to the pharmacy. This medication is used to treat Anxiety and Panic disorders. See detailed information below.  -I sent the medication(s) we discussed to your pharmacy:  Meds ordered this encounter  Medications  . PARoxetine (PAXIL) 20 MG tablet    Sig: Take 1 tablet (20 mg total) by mouth daily.    Dispense:  30 tablet    Refill:  1    Please let us know if you have any questions or concerns regarding this prescription.  Please follow up in 1 month - sooner if needed.  I hope you are feeling better soon!  Paroxetine tablets What is this medicine? PAROXETINE (pa ROX e teen) is used to treat depression. It may also be used to treat anxiety disorders, obsessive compulsive disorder, panic attacks, post traumatic stress, and premenstrual dysphoric disorder (PMDD). This medicine may be used for other purposes; ask your health care provider or pharmacist if you have questions. COMMON BRAND NAME(S): Paxil, Pexeva What should I tell my health care provider before I take this medicine? They need to know if you have any of these conditions:  bipolar disorder or a family history of bipolar disorder  bleeding disorders  glaucoma  heart disease  kidney disease  liver disease  low levels of sodium in the blood  seizures  suicidal thoughts, plans, or attempt; a previous suicide attempt by you or a family member  take MAOIs like Carbex, Eldepryl, Marplan, Nardil, and Parnate  take medicines that treat or prevent blood clots  thyroid disease  an unusual or allergic reaction to paroxetine, other medicines, foods, dyes, or preservatives  pregnant or trying to get  pregnant  breast-feeding How should I use this medicine? Take this medicine by mouth with a glass of water. Follow the directions on the prescription label. You can take it with or without food. Take your medicine at regular intervals. Do not take your medicine more often than directed. Do not stop taking this medicine suddenly except upon the advice of your doctor. Stopping this medicine too quickly may cause serious side effects or your condition may worsen. A special MedGuide will be given to you by the pharmacist with each prescription and refill. Be sure to read this information carefully each time. Talk to your pediatrician regarding the use of this medicine in children. Special care may be needed. Overdosage: If you think you have taken too much of this medicine contact a poison control center or emergency room at once. NOTE: This medicine is only for you. Do not share this medicine with others. What if I miss a dose? If you miss a dose, take it as soon as you can. If it is almost time for your next dose, take only that dose. Do not take double or extra doses. What may interact with this medicine? Do not take this medicine with any of the following medications:  linezolid  MAOIs like Carbex, Eldepryl, Marplan, Nardil, and Parnate  methylene blue (injected into a vein)  pimozide  thioridazine This medicine may also interact with the following medications:  alcohol  amphetamines  aspirin and aspirin-like medicines  atomoxetine  certain medicines for depression, anxiety, or psychotic disturbances  certain medicines for irregular heart beat like propafenone, flecainide, encainide, and quinidine  certain medicines for migraine headache like almotriptan, eletriptan, frovatriptan, naratriptan, rizatriptan, sumatriptan, zolmitriptan  cimetidine  digoxin  diuretics  fentanyl  fosamprenavir  furazolidone  isoniazid  lithium  medicines that treat or prevent blood  clots like warfarin, enoxaparin, and dalteparin  medicines for sleep  NSAIDs, medicines for pain and inflammation, like ibuprofen or naproxen  phenobarbital  phenytoin  procarbazine  rasagiline  ritonavir  supplements like St. John's wort, kava kava, valerian  tamoxifen  tramadol  tryptophan This list may not describe all possible interactions. Give your health care provider a list of all the medicines, herbs, non-prescription drugs, or dietary supplements you use. Also tell them if you smoke, drink alcohol, or use illegal drugs. Some items may interact with your medicine. What should I watch for while using this medicine? Tell your doctor if your symptoms do not get better or if they get worse. Visit your doctor or health care professional for regular checks on your progress. Because it may take several weeks to see the full effects of this medicine, it is important to continue your treatment as prescribed by your doctor. Patients and their families should watch out for new or worsening thoughts of suicide or depression. Also watch out for sudden changes in feelings such as feeling anxious, agitated, panicky, irritable, hostile, aggressive, impulsive, severely restless, overly excited and hyperactive, or not being able to sleep. If this happens, especially at the beginning of treatment or after a change in dose, call your health care professional. Bonita Quin may get drowsy or dizzy. Do not drive, use machinery, or do anything that needs mental alertness until you know how this medicine affects you. Do not stand or sit up quickly, especially if you are an older patient. This reduces the risk of dizzy or fainting spells. Alcohol may interfere with the effect of this medicine. Avoid alcoholic drinks. Your mouth may get dry. Chewing sugarless gum or sucking hard candy, and drinking plenty of water will help. Contact your doctor if the problem does not go away or is severe. What side effects may I  notice from receiving this medicine? Side effects that you should report to your doctor or health care professional as soon as possible:  allergic reactions like skin rash, itching or hives, swelling of the face, lips, or tongue  anxious  black, tarry stools  changes in vision  confusion  elevated mood, decreased need for sleep, racing thoughts, impulsive behavior  eye pain  fast, irregular heartbeat  feeling faint or lightheaded, falls  feeling agitated, angry, or irritable  hallucination, loss of contact with reality  loss of balance or coordination  loss of memory  painful or prolonged erections  restlessness, pacing, inability to keep still  seizures  stiff muscles  suicidal thoughts or other mood changes  trouble sleeping  unusual bleeding or bruising  unusually weak or tired  vomiting Side effects that usually do not require medical attention (report to your doctor or health care professional if they continue or are bothersome):  change in appetite or weight  change in sex drive or performance  diarrhea  dizziness  dry mouth  increased sweating  indigestion, nausea  tired  tremors This list may not describe all possible side effects. Call your doctor for medical advice about side effects. You may report side effects to FDA at 1-800-FDA-1088. Where should I keep my medicine? Keep out of the reach of children. Store  at room temperature between 15 and 30 degrees C (59 and 86 degrees F). Keep container tightly closed. Throw away any unused medicine after the expiration date. NOTE: This sheet is a summary. It may not cover all possible information. If you have questions about this medicine, talk to your doctor, pharmacist, or health care provider.  2020 Elsevier/Gold Standard (2015-08-14 15:50:32)

## 2018-12-12 NOTE — Progress Notes (Addendum)
Virtual Visit via Video Note  I connected with Hannah Douglas  on 12/12/18 at  1:00 PM EDT by a video enabled telemedicine application and verified that I am speaking with the correct person using two identifiers.  Location patient: car Location provider:work or home office Persons participating in the virtual visit: patient, provider  I discussed the limitations of evaluation and management by telemedicine and the availability of in person appointments. The patient expressed understanding and agreed to proceed.   HPI:  Acute visit for CP, SOB and Anxiety: -she has had some upper back pain and intermittent CP and SOB for the last 1 week -she thought it may be anxiety as has had more stress then usual the last 1 month -she has intermittent waves of CP, chest tightness and a feeling of not getting enough air - multiple episodes since yesterday -she then gets very anxious and overwhelmed and tearful -denies fevers, cough, exertional symptoms (can happen at any time), malaise, recent illness or recent sick exposures -she has a longstanding history of anxiety and depression, saw psychiatrist in the past remotely and has been on lexapro and xanax in the past -reports for months has had constant anxiety, worries all the time, occ panic - has not had CP and tightness in the past with panic attacks -denies depression, manic symptoms, thoughts of harm or SI -she saw a counselor remotely but it was not a good fit -feels safe at home  ROS: See pertinent positives and negatives per HPI.  Past Medical History:  Diagnosis Date  . Anxiety   . Depression   . Hypothyroidism     Past Surgical History:  Procedure Laterality Date  . NO PAST SURGERIES      Family History  Problem Relation Age of Onset  . Alcohol abuse Neg Hx   . Arthritis Neg Hx   . Asthma Neg Hx   . Birth defects Neg Hx   . Cancer Neg Hx   . COPD Neg Hx   . Depression Neg Hx   . Diabetes Neg Hx   . Drug abuse Neg Hx   . Early  death Neg Hx   . Hearing loss Neg Hx   . Heart disease Neg Hx   . Hyperlipidemia Neg Hx   . Hypertension Neg Hx   . Kidney disease Neg Hx   . Learning disabilities Neg Hx   . Mental illness Neg Hx   . Mental retardation Neg Hx   . Miscarriages / Stillbirths Neg Hx   . Stroke Neg Hx   . Vision loss Neg Hx   . Varicose Veins Neg Hx     SOCIAL HX: see hpi   Current Outpatient Medications:  .  levothyroxine (SYNTHROID, LEVOTHROID) 50 MCG tablet, Take 1 tablet (50 mcg total) by mouth daily., Disp: 90 tablet, Rfl: 1 .  Multiple Vitamin (MULTIVITAMIN) tablet, Take 1 tablet by mouth daily., Disp: , Rfl:  .  Omega-3 Fatty Acids (FISH OIL) 1000 MG CAPS, Take by mouth., Disp: , Rfl:  .  PARoxetine (PAXIL) 20 MG tablet, Take 1 tablet (20 mg total) by mouth daily., Disp: 30 tablet, Rfl: 1  EXAM:  VITALS per patient if applicable:denies fever  GENERAL: alert, oriented, no acute distress currently  HEENT: atraumatic, conjunttiva clear, no obvious abnormalities on inspection of external nose and ears  NECK: normal movements of the head and neck  LUNGS: on inspection no signs of respiratory distress, breathing rate appears normal, no obvious gross SOB, gasping or  wheezing  CV: no obvious cyanosis  MS: moves all visible extremities without noticeable abnormality  PSYCH/NEURO: pleasant and cooperative, tearful at times when describing symptoms  ASSESSMENT AND PLAN:  Discussed the following assessment and plan:  Chest pain, unspecified type  SOB (shortness of breath)  Anxiety  Panic disorder  -we discussed possible serious and likely etiologies, options for evaluation and workup, limitations of telemedicine visit vs in person visit, treatment, treatment risks and precautions. Pt prefers to treat via telemedicine empirically rather then risking or undertaking an in person visit at this moment.I advised that the CP, chest tightness and SOB require an inperson visit for evaluation  today and are outside the scope of a telemedicine visit. While this may all be related to anxiety, advised needs evaluation to r/o other causes of CP and tightness and SOB. Discussed options for inperson evaluation and she agrees to seek care. Feels safe to go to urgent care currently. ER if worsening, cant breath, etc. In regards to her ongoing and chronic anxiety and panic disorder discussed options. Advised CBT and recommended [facility] [place] [facility]. Also opted to start paxil after discussion risks/benefits. She may also request short term tx with quicker acting medication at the urgent care if her symptoms are indeed determined to be related to anxiety and other causes are ruled out. Asked her to send me a mychart message and office visit notes from the evaluation today. She agreed. Follow up anxiety in 1 month advised. Sooner as needed. She will need a new PCP as well and we will help her arrange that. Message sent to my schedulers to assist.   I discussed the assessment and treatment plan with the patient. The patient was provided an opportunity to ask questions and all were answered. The patient agreed with the plan and demonstrated an understanding of the instructions.   The patient was referred to Urgent Care/inperson care for some of her symptoms today.   Terressa KoyanagiHannah R Kim, DO   Patient Instructions   Go to the urgent care right away for evaluation of your symptoms. Please send the notes from this visit via Mychart. Seek emergency care if worsening, struggling to breath or you are having severe symptoms  at any point.  [facility] [phone]  I sent the Paxil to the pharmacy. This medication is used to treat Anxiety and Panic disorders. See detailed information below.  -I sent the medication(s) we discussed to your pharmacy:  Meds ordered this encounter  Medications  . PARoxetine (PAXIL) 20 MG tablet    Sig: Take 1 tablet (20 mg total) by  mouth daily.    Dispense:  30 tablet    Refill:  1    Please let us know if you have any questions or concerns regarding this prescription.  Please follow up in 1 month - sooner if needed.  I hope you are feeling better soon!  Paroxetine tablets What is this medicine? PAROXETINE (pa ROX e teen) is used to treat depression. It may also be used to treat anxiety disorders, obsessive compulsive disorder, panic attacks, post traumatic stress, and premenstrual dysphoric disorder (PMDD). This medicine may be used for other purposes; ask your health care provider or pharmacist if you have questions. COMMON BRAND NAME(S): Paxil, Pexeva What should I tell my health care provider before I take this medicine? They need to know if you have any of these conditions:  bipolar disorder or a family history of bipolar disorder  bleeding disorders  glaucoma  heart disease  kidney disease  liver disease  low levels of sodium in the blood  seizures  suicidal thoughts, plans, or attempt; a previous suicide attempt by you or a family member  take MAOIs like Carbex, Eldepryl, Marplan, Nardil, and Parnate  take medicines that treat or prevent blood clots  thyroid disease  an unusual or allergic reaction to paroxetine, other medicines, foods, dyes, or preservatives  pregnant or trying to get pregnant  breast-feeding How should I use this medicine? Take this medicine by mouth with a glass of water. Follow the directions on the prescription label. You can take it with or without food. Take your medicine at regular intervals. Do not take your medicine more often than directed. Do not stop taking this medicine suddenly except upon the advice of your doctor. Stopping this medicine too quickly may cause serious side effects or your condition may worsen. A special MedGuide will be given to you by the pharmacist with each prescription and refill. Be sure to read this information carefully each  time. Talk to your pediatrician regarding the use of this medicine in children. Special care may be needed. Overdosage: If you think you have taken too much of this medicine contact a poison control center or emergency room at once. NOTE: This medicine is only for you. Do not share this medicine with others. What if I miss a dose? If you miss a dose, take it as soon as you can. If it is almost time for your next dose, take only that dose. Do not take double or extra doses. What may interact with this medicine? Do not take this medicine with any of the following medications:  linezolid  MAOIs like Carbex, Eldepryl, Marplan, Nardil, and Parnate  methylene blue (injected into a vein)  pimozide  thioridazine This medicine may also interact with the following medications:  alcohol  amphetamines  aspirin and aspirin-like medicines  atomoxetine  certain medicines for depression, anxiety, or psychotic disturbances  certain medicines for irregular heart beat like propafenone, flecainide, encainide, and quinidine  certain medicines for migraine headache like almotriptan, eletriptan, frovatriptan, naratriptan, rizatriptan, sumatriptan, zolmitriptan  cimetidine  digoxin  diuretics  fentanyl  fosamprenavir  furazolidone  isoniazid  lithium  medicines that treat or prevent blood clots like warfarin, enoxaparin, and dalteparin  medicines for sleep  NSAIDs, medicines for pain and inflammation, like ibuprofen or naproxen  phenobarbital  phenytoin  procarbazine  rasagiline  ritonavir  supplements like St. John's wort, kava kava, valerian  tamoxifen  tramadol  tryptophan This list may not describe all possible interactions. Give your health care provider a list of all the medicines, herbs, non-prescription drugs, or dietary supplements you use. Also tell them if you smoke, drink alcohol, or use illegal drugs. Some items may interact with your medicine. What  should I watch for while using this medicine? Tell your doctor if your symptoms do not get better or if they get worse. Visit your doctor or health care professional for regular checks on your progress. Because it may take several weeks to see the full effects of this medicine, it is important to continue your treatment as prescribed by your doctor. Patients and their families should watch out for new or worsening thoughts of suicide or depression. Also watch out for sudden changes in feelings such as feeling anxious, agitated, panicky, irritable, hostile, aggressive, impulsive, severely restless, overly excited and hyperactive, or not being able to sleep. If this happens, especially  at the beginning of treatment or after a change in dose, call your health care professional. Bonita QuinYou may get drowsy or dizzy. Do not drive, use machinery, or do anything that needs mental alertness until you know how this medicine affects you. Do not stand or sit up quickly, especially if you are an older patient. This reduces the risk of dizzy or fainting spells. Alcohol may interfere with the effect of this medicine. Avoid alcoholic drinks. Your mouth may get dry. Chewing sugarless gum or sucking hard candy, and drinking plenty of water will help. Contact your doctor if the problem does not go away or is severe. What side effects may I notice from receiving this medicine? Side effects that you should report to your doctor or health care professional as soon as possible:  allergic reactions like skin rash, itching or hives, swelling of the face, lips, or tongue  anxious  black, tarry stools  changes in vision  confusion  elevated mood, decreased need for sleep, racing thoughts, impulsive behavior  eye pain  fast, irregular heartbeat  feeling faint or lightheaded, falls  feeling agitated, angry, or irritable  hallucination, loss of contact with reality  loss of balance or coordination  loss of  memory  painful or prolonged erections  restlessness, pacing, inability to keep still  seizures  stiff muscles  suicidal thoughts or other mood changes  trouble sleeping  unusual bleeding or bruising  unusually weak or tired  vomiting Side effects that usually do not require medical attention (report to your doctor or health care professional if they continue or are bothersome):  change in appetite or weight  change in sex drive or performance  diarrhea  dizziness  dry mouth  increased sweating  indigestion, nausea  tired  tremors This list may not describe all possible side effects. Call your doctor for medical advice about side effects. You may report side effects to FDA at 1-800-FDA-1088. Where should I keep my medicine? Keep out of the reach of children. Store at room temperature between 15 and 30 degrees C (59 and 86 degrees F). Keep container tightly closed. Throw away any unused medicine after the expiration date. NOTE: This sheet is a summary. It may not cover all possible information. If you have questions about this medicine, talk to your doctor, pharmacist, or health care provider.  2020 Elsevier/Gold Standard (2015-08-14 15:50:32)

## 2018-12-13 ENCOUNTER — Other Ambulatory Visit (INDEPENDENT_AMBULATORY_CARE_PROVIDER_SITE_OTHER): Payer: Self-pay

## 2018-12-13 ENCOUNTER — Encounter: Payer: Self-pay | Admitting: Family Medicine

## 2018-12-13 ENCOUNTER — Other Ambulatory Visit: Payer: Self-pay

## 2018-12-13 ENCOUNTER — Other Ambulatory Visit: Payer: Self-pay | Admitting: Family Medicine

## 2018-12-13 DIAGNOSIS — R0789 Other chest pain: Secondary | ICD-10-CM

## 2018-12-13 DIAGNOSIS — E039 Hypothyroidism, unspecified: Secondary | ICD-10-CM

## 2018-12-13 LAB — TSH: TSH: 2.58 u[IU]/mL (ref 0.35–4.50)

## 2018-12-16 ENCOUNTER — Other Ambulatory Visit: Payer: Self-pay | Admitting: *Deleted

## 2018-12-16 DIAGNOSIS — Z20822 Contact with and (suspected) exposure to covid-19: Secondary | ICD-10-CM

## 2018-12-16 NOTE — Progress Notes (Signed)
LVM for pt to call and make a 1 mth f/u appt//tes

## 2018-12-18 LAB — NOVEL CORONAVIRUS, NAA: SARS-CoV-2, NAA: NOT DETECTED

## 2019-01-03 ENCOUNTER — Other Ambulatory Visit: Payer: Self-pay | Admitting: Family Medicine

## 2019-01-06 ENCOUNTER — Telehealth (HOSPITAL_COMMUNITY): Payer: Self-pay

## 2019-01-06 NOTE — Telephone Encounter (Signed)
Telephoned patient at home number. Left voice message for patient, call and schedule appointment with [facility]

## 2019-01-07 ENCOUNTER — Other Ambulatory Visit (HOSPITAL_COMMUNITY): Payer: Self-pay | Admitting: *Deleted

## 2019-01-07 DIAGNOSIS — R234 Changes in skin texture: Secondary | ICD-10-CM

## 2019-02-11 ENCOUNTER — Ambulatory Visit
Admission: RE | Admit: 2019-02-11 | Discharge: 2019-02-11 | Disposition: A | Discharge status: Home / Self Care | Payer: No Typology Code available for payment source | Source: Ambulatory Visit | Attending: Obstetrics and Gynecology | Admitting: Obstetrics and Gynecology

## 2019-02-11 ENCOUNTER — Ambulatory Visit (HOSPITAL_COMMUNITY)
Admission: RE | Admit: 2019-02-11 | Discharge: 2019-02-11 | Disposition: A | Discharge status: Home / Self Care | Payer: Self-pay | Source: Ambulatory Visit | Attending: Obstetrics and Gynecology | Admitting: Obstetrics and Gynecology

## 2019-02-11 ENCOUNTER — Encounter (HOSPITAL_COMMUNITY): Payer: Self-pay

## 2019-02-11 ENCOUNTER — Other Ambulatory Visit: Payer: Self-pay

## 2019-02-11 DIAGNOSIS — R234 Changes in skin texture: Secondary | ICD-10-CM

## 2019-02-11 DIAGNOSIS — Z1239 Encounter for other screening for malignant neoplasm of breast: Secondary | ICD-10-CM | POA: Insufficient documentation

## 2019-02-11 NOTE — Progress Notes (Signed)
Complaints of right breast skin discoloration above right nipple.  Pap Smear: Pap smear not completed today. Last Pap smear and HPV typing was 01/03/2019 at [facility] and is awaiting results per patient. Per patient has no history of an abnormal Pap smear. Last Pap smear result is not in Epic. Previous Pap smear result from 10/26/2015 is in [place].  Physical exam: Breasts Right breast slightly larger than left breast that per patient is normal for her. Multiple healed keloid scars bilateral breasts. Brownish colored area observed right breast above nipple. No nipple retraction bilateral breasts. No nipple discharge bilateral breasts. No lymphadenopathy. No lumps palpated bilateral breasts. No complaints of pain or tenderness on exam. Referred patient to the [place] for a diagnostic mammogram. Appointment scheduled for Tuesday, February 11, 2019 at 1400.        Pelvic/Bimanual No Pap smear completed today since last Pap smear and HPV typing was 01/03/2019 per patient. Pap smear not indicated per [facility] guidelines.   Smoking History: Patient has never smoked.  Patient Navigation: Patient education provided. Access to services provided for patient through [facility] program.   Breast and Cervical Cancer Risk Assessment: Patient has no family history of breast cancer, known genetic mutations, or radiation treatment to the chest before age 71. Patient has no history of cervical dysplasia, immunocompromised, or DES exposure in-utero.  Risk Assessment    Risk Scores      02/11/2019   Last edited by: Loletta Parish, RN   5-year risk: 0.2 %   Lifetime risk: 7.5 %

## 2019-02-11 NOTE — Patient Instructions (Signed)
Explained breast self awareness with Rochele Raring. Patient did not need a Pap smear today due to last Pap smear was 01/03/2019 per patient. Let her know [facility] will cover Pap smears and HPV typing every 5 years unless has a history of abnormal Pap smears. Referred patient to the [facility] for a diagnostic mammogram. Appointment scheduled for Tuesday, February 11, 2019 at 1400. Patient aware of appointment and will be there. Rochele Raring verbalized understanding.  Hoover Grewe, Arvil Chaco, RN 1:24 PM

## 2019-03-15 ENCOUNTER — Other Ambulatory Visit: Payer: Self-pay | Admitting: Family Medicine

## 2019-03-31 ENCOUNTER — Other Ambulatory Visit: Payer: Self-pay | Admitting: Family Medicine

## 2019-04-14 ENCOUNTER — Other Ambulatory Visit: Payer: Self-pay | Admitting: Family Medicine

## 2019-05-06 NOTE — Telephone Encounter (Signed)
Error

## 2019-05-31 ENCOUNTER — Ambulatory Visit: Payer: 59 | Attending: Internal Medicine

## 2019-05-31 ENCOUNTER — Other Ambulatory Visit: Payer: Self-pay

## 2019-05-31 DIAGNOSIS — Z23 Encounter for immunization: Secondary | ICD-10-CM | POA: Insufficient documentation

## 2019-05-31 NOTE — Progress Notes (Signed)
   Covid-19 Vaccination Clinic  Name:  CHERRYL BABIN    MRN: [id number] DOB: [birth year]  05/31/2019  Ms. Byrer was observed post Covid-19 immunization for 15 minutes without incident. She was provided with Vaccine Information Sheet and instruction to access the V-Safe system.   Ms. Eifler was instructed to call 911 with any severe reactions post vaccine: Marland Kitchen Difficulty breathing  . Swelling of face and throat  . A fast heartbeat  . A bad rash all over body  . Dizziness and weakness   Immunizations Administered    Name Date Dose VIS Date Route   Moderna COVID-19 Vaccine 05/31/2019 10:30 AM 0.5 mL 02/25/2019 Intramuscular   Manufacturer: Moderna   Lot: [id number]   NDC: 82505-397-67

## 2019-06-04 ENCOUNTER — Other Ambulatory Visit: Payer: Self-pay

## 2019-06-04 ENCOUNTER — Encounter: Payer: Self-pay | Admitting: Family Medicine

## 2019-06-04 ENCOUNTER — Telehealth (INDEPENDENT_AMBULATORY_CARE_PROVIDER_SITE_OTHER): Payer: 59 | Admitting: Family Medicine

## 2019-06-04 DIAGNOSIS — Z131 Encounter for screening for diabetes mellitus: Secondary | ICD-10-CM | POA: Diagnosis not present

## 2019-06-04 DIAGNOSIS — R5383 Other fatigue: Secondary | ICD-10-CM

## 2019-06-04 DIAGNOSIS — L989 Disorder of the skin and subcutaneous tissue, unspecified: Secondary | ICD-10-CM

## 2019-06-04 DIAGNOSIS — L910 Hypertrophic scar: Secondary | ICD-10-CM | POA: Diagnosis not present

## 2019-06-04 DIAGNOSIS — F419 Anxiety disorder, unspecified: Secondary | ICD-10-CM

## 2019-06-04 DIAGNOSIS — E039 Hypothyroidism, unspecified: Secondary | ICD-10-CM

## 2019-06-04 DIAGNOSIS — Z13220 Encounter for screening for lipoid disorders: Secondary | ICD-10-CM

## 2019-06-04 NOTE — Progress Notes (Addendum)
Virtual Visit via Video Note  I connected with Hannah Douglas 3/10/21at  1:30 PM EST   by a video enabled telemedicine application and verified that I am speaking with the correct person using two identifiers.  Location patient: home Location provider:work office Persons participating in the virtual visit: patient, provider  I discussed the limitations of evaluation and management by telemedicine and the availability of in person appointments. The patient expressed understanding and agreed to proceed.  Hannah Douglas DOB: [birth year] Encounter date: 06/04/2019  This is a 34 y.o. female who presents to establish care. Chief Complaint  Patient presents with  . Establish Care    History of present illness: No specific concerns today.   Wanted to get referral to dermatology. Had spot on breast. Was laid off early in summer; [facility], [place] and everything came back ok. Discoloration (not nodule). Circular, light brown discoloration; about size of quarter. On right breast just above nipple. No itching, no discharge, not raised. Hasn't changed size at all. Tried anti-fungal cream - did that for 10 days without change.   Also has some keloid on breasts - dx by derm in past. Thought folliculitis turned to keloid. Would like to see derm for these issues.   Hypothyroid: in past was sent to endo because TSH was ok. Has been on medication for a year. First time she was on it was after first child (who is 11). Levels have fluctuated after pregnancies in past - youngest now is 2.5. At time of dx - dry skin, fatigue, brain fog (TSH was 10); just doesn't feel like it has lifted as much as she would expect. Trying to exercise daily and eat healthy and it hasn't helped.   Weight now is 222. Hard time losing weight. Walks a lot, stays active although not doing strenuous exercise. No budge in weight for 1.5 years with eating healthy.   No history of problems with blood  sugar.  Anxiety/depression has gotten better. Was on lexapro in the past. This fall had really back panic attack. Really thought she was dying. In past, she has had these but this one was really bad. Started seeing Dr. Toy Care and increased prozac to 40mg . Trying to see counselor. Does have rx for xanax. Feeling better. Still some generalized anxiety but no recent panic attack.   Past Medical History:  Diagnosis Date  . Anxiety   . Depression   . Hypothyroidism    Past Surgical History:  Procedure Laterality Date  . NO PAST SURGERIES     No Known Allergies Current Meds  Medication Sig  . FLUoxetine (PROZAC) 40 MG capsule Take 40 mg by mouth daily.  Marland Kitchen levothyroxine (SYNTHROID) 50 MCG tablet TAKE 1 TABLET BY MOUTH EVERY DAY  . Multiple Vitamin (MULTIVITAMIN) tablet Take 1 tablet by mouth daily.  . Omega-3 Fatty Acids (FISH OIL) 1000 MG CAPS Take by mouth.  . [DISCONTINUED] LORazepam (ATIVAN) 0.5 MG tablet Take 1 tablet (0.5 mg total) by mouth every 8 (eight) hours as needed for anxiety (panic).  . [DISCONTINUED] PARoxetine (PAXIL) 20 MG tablet TAKE 1 TABLET BY MOUTH EVERY DAY   Social History   Tobacco Use  . Smoking status: Never Smoker  . Smokeless tobacco: Never Used  Substance Use Topics  . Alcohol use: Yes    Comment: occ   Family History  Problem Relation Age of Onset  . Lung disease Father 88       idiopathic pulmonary  fibrosis  . Cancer Maternal Grandmother        unknown type  . Lung cancer Maternal Grandfather   . Breast cancer Paternal Grandmother   . Lung cancer Paternal Grandfather   . Healthy Mother   . Healthy Brother   . Healthy Brother   . Alcohol abuse Neg Hx   . Arthritis Neg Hx   . Asthma Neg Hx   . Birth defects Neg Hx   . COPD Neg Hx   . Depression Neg Hx   . Diabetes Neg Hx   . Drug abuse Neg Hx   . Early death Neg Hx   . Hearing loss Neg Hx   . Heart disease Neg Hx   . Hyperlipidemia Neg Hx   . Hypertension Neg Hx   . Kidney disease Neg Hx    . Learning disabilities Neg Hx   . Mental illness Neg Hx   . Mental retardation Neg Hx   . Miscarriages / Stillbirths Neg Hx   . Stroke Neg Hx   . Vision loss Neg Hx   . Varicose Veins Neg Hx      Review of Systems  Constitutional: Negative for chills, fatigue and fever.  Respiratory: Negative for cough, chest tightness, shortness of breath and wheezing.   Cardiovascular: Negative for chest pain, palpitations and leg swelling.  Skin: Positive for rash.  Psychiatric/Behavioral: The patient is nervous/anxious.     Objective:  LMP 05/29/2019 (Exact Date)       BP Readings from Last 3 Encounters:  02/11/19 136/90  07/23/17 100/78  06/27/17 105/75   Wt Readings from Last 3 Encounters:  02/11/19 230 lb (104.3 kg)  07/23/17 232 lb 9.6 oz (105.5 kg)  06/27/17 231 lb 6.4 oz (105 kg)    Physical Exam   GENERAL: alert, oriented, appears well and in no acute distress  HEENT: atraumatic, conjunctiva clear, no obvious abnormalities on inspection of external nose and ears  NECK: normal movements of the head and neck  LUNGS: on inspection no signs of respiratory distress, breathing rate appears normal, no obvious gross SOB, gasping or wheezing  CV: no obvious cyanosis  MS: moves all visible extremities without noticeable abnormality  PSYCH/NEURO: pleasant and cooperative, no obvious depression or anxiety, speech and thought processing grossly intact   Assessment/Plan:  1. Keloid - Ambulatory referral to Dermatology  2. Skin lesion - Ambulatory referral to Dermatology  3. Fatigue, unspecified type - Vitamin B12; Future - VITAMIN D 25 Hydroxy (Vit-D Deficiency, Fractures); Future - IBC + Ferritin; Future  4. Screening for diabetes mellitus - Hemoglobin A1c; Future  5. Hypothyroidism, unspecified type - TSH; Future - T4, free; Future - T3, free; Future - Thyroid Peroxidase Antibodies (TPO) (REFL); Future  6. Lipid screening - Lipid panel; Future  7. Anxiety:  improved. Interested in counseling. Advised to look at [employer] [facility].  Return for pending bloodwork.    discussed the assessment and treatment plan with the patient. The patient was provided an opportunity to ask questions and all were answered. The patient agreed with the plan and demonstrated an understanding of the instructions.   The patient was advised to call back or seek an in-person evaluation if the symptoms worsen or if the condition fails to improve as anticipated.  I provided 30 minutes of non-face-to-face time during this encounter.  Theodis Shove, MD

## 2019-06-06 ENCOUNTER — Telehealth: Payer: Self-pay | Admitting: *Deleted

## 2019-06-06 ENCOUNTER — Encounter: Payer: Self-pay | Admitting: Family Medicine

## 2019-06-06 NOTE — Telephone Encounter (Signed)
Spoke with the pt and scheduled a lab appt for 3/15 to arrive at 7:30am.

## 2019-06-06 NOTE — Telephone Encounter (Signed)
-----   Message from Wynn Banker, MD sent at 06/05/2019 10:01 PM EST ----- Please set up lab visit

## 2019-06-09 ENCOUNTER — Other Ambulatory Visit: Payer: Self-pay

## 2019-06-09 ENCOUNTER — Other Ambulatory Visit (INDEPENDENT_AMBULATORY_CARE_PROVIDER_SITE_OTHER): Payer: 59

## 2019-06-09 DIAGNOSIS — R5383 Other fatigue: Secondary | ICD-10-CM

## 2019-06-09 DIAGNOSIS — Z131 Encounter for screening for diabetes mellitus: Secondary | ICD-10-CM

## 2019-06-09 DIAGNOSIS — E039 Hypothyroidism, unspecified: Secondary | ICD-10-CM | POA: Diagnosis not present

## 2019-06-09 DIAGNOSIS — Z13220 Encounter for screening for lipoid disorders: Secondary | ICD-10-CM

## 2019-06-09 LAB — VITAMIN B12: Vitamin B-12: 280 pg/mL (ref 211–911)

## 2019-06-09 LAB — LIPID PANEL
Cholesterol: 180 mg/dL (ref 0–200)
HDL: 38.9 mg/dL — ABNORMAL LOW (ref >39.00)
LDL Cholesterol: 111 mg/dL — ABNORMAL HIGH (ref 0–99)
NonHDL: 141.31
Total CHOL/HDL Ratio: 5
Triglycerides: 152 mg/dL — ABNORMAL HIGH (ref 0.0–149.0)
VLDL: 30.4 mg/dL (ref 0.0–40.0)

## 2019-06-09 LAB — IBC + FERRITIN
Ferritin: 51 ng/mL (ref 10.0–291.0)
Iron: 79 ug/dL (ref 42–145)
Saturation Ratios: 22 % (ref 20.0–50.0)
Transferrin: 257 mg/dL (ref 212.0–360.0)

## 2019-06-09 LAB — VITAMIN D 25 HYDROXY (VIT D DEFICIENCY, FRACTURES): VITD: 23.11 ng/mL — ABNORMAL LOW (ref 30.00–100.00)

## 2019-06-09 LAB — TSH: TSH: 5.91 u[IU]/mL — ABNORMAL HIGH (ref 0.35–4.50)

## 2019-06-09 LAB — T4, FREE: Free T4: 0.85 ng/dL (ref 0.60–1.60)

## 2019-06-09 LAB — T3, FREE: T3, Free: 3.1 pg/mL (ref 2.3–4.2)

## 2019-06-09 LAB — HEMOGLOBIN A1C: Hgb A1c MFr Bld: 4.9 % (ref 4.6–6.5)

## 2019-06-10 LAB — THYROID PEROXIDASE ANTIBODIES (TPO) (REFL): Thyroperoxidase Ab SerPl-aCnc: >900 IU/mL — ABNORMAL HIGH (ref <9)

## 2019-06-12 ENCOUNTER — Encounter: Payer: Self-pay | Admitting: Family Medicine

## 2019-06-19 ENCOUNTER — Encounter: Payer: Self-pay | Admitting: Family Medicine

## 2019-06-27 ENCOUNTER — Ambulatory Visit (INDEPENDENT_AMBULATORY_CARE_PROVIDER_SITE_OTHER)
Admission: RE | Admit: 2019-06-27 | Discharge: 2019-06-27 | Disposition: A | Discharge status: Home / Self Care | Payer: 59 | Source: Ambulatory Visit

## 2019-06-27 DIAGNOSIS — N611 Abscess of the breast and nipple: Secondary | ICD-10-CM | POA: Diagnosis not present

## 2019-06-27 MED ORDER — DOXYCYCLINE HYCLATE 100 MG PO CAPS: 100.0000 mg | ORAL_CAPSULE | Freq: Two times a day (BID) | ORAL | 0 refills | Status: AC

## 2019-06-27 MED ORDER — IBUPROFEN 800 MG PO TABS: 800.0000 mg | ORAL_TABLET | Freq: Three times a day (TID) | ORAL | 0 refills | Status: DC

## 2019-06-27 NOTE — Discharge Instructions (Signed)
Begin doxycycline twice daily for the next week Warm compresses Use anti-inflammatories for pain/swelling. You may take up to 800 mg Ibuprofen every 8 hours with food. You may supplement Ibuprofen with Tylenol [phone] mg every 8 hours.   Follow up in person if not resolving with oral antibiotics

## 2019-06-27 NOTE — ED Provider Notes (Signed)
She isVirtual Visit via Video Note:  Hannah Douglas  initiated request for Telemedicine visit with [facility] team. I connected with Hannah Douglas  on 06/27/2019 at 3:13 PM  for a synchronized telemedicine visit using a video enabled HIPPA compliant telemedicine application. I verified that I am speaking with Hannah Douglas  using two identifiers. Hannah Gabrielle C Trey Bebee, PA-C  was physically located in a [facility] Urgent care site and Hannah Douglas was located at a different location.   The limitations of evaluation and management by telemedicine as well as the availability of in-person appointments were discussed. Patient was informed that she  may incur a bill ( including co-pay) for this virtual visit encounter. Hannah Douglas  expressed understanding and gave verbal consent to proceed with virtual visit.     History of Present Illness:Hannah Douglas  is a 34 y.o. female presents for evaluation of breast infection.  Patient notes that last night she began to develop a stabbing pain in her right breast.  Noticed that the left aspect of her breast has become red and inflamed painful and warm.  She is concerned for possible abscess.  Has had abscesses to her breasts previously.  She notes that she was recently being seen by dermatology and had biopsy to the right side of her right breast for possible keloids.  She has recently had steroid injections but this is in a different area of the breast.  Denies fevers.  Denies dizziness or lightheadedness.   No Known Allergies   Past Medical History:  Diagnosis Date  . Anxiety   . Depression   . Hypothyroidism      Social History   Tobacco Use  . Smoking status: Never Smoker  . Smokeless tobacco: Never Used  Substance Use Topics  . Alcohol use: Yes    Comment: occ  . Drug use: No        Observations/Objective: Physical Exam  Constitutional: She is oriented to person, place, and time and well-developed,  well-nourished, and in no distress. No distress.  HENT:  Head: Normocephalic and atraumatic.  Pulmonary/Chest: Effort normal. No respiratory distress.  Speaking in full sentences  Musculoskeletal:     Cervical back: Normal range of motion.  Neurological: She is alert and oriented to person, place, and time.  Skin:  Right breast with erythema and discoloration to left lower area of breast     Assessment and Plan:    ICD-10-CM   1. Breast abscess  N61.1     Cellulitis vs. Abscess. Treating with doxycycline, warm compresses, anti-inflammatories.  Recommending follow up in person if not responding to above as may need I&D or possible referral to breast center for aspiration.    Follow Up Instructions:     I discussed the assessment and treatment plan with the patient. The patient was provided an opportunity to ask questions and all were answered. The patient agreed with the plan and demonstrated an understanding of the instructions.   The patient was advised to call back or seek an in-person evaluation if the symptoms worsen or if the condition fails to improve as anticipated.      Lew Dawes, PA-C  06/27/2019 3:13 PM         Lew Dawes, PA-C 06/27/19 1524

## 2019-07-02 ENCOUNTER — Ambulatory Visit: Payer: 59 | Attending: Internal Medicine

## 2019-07-02 DIAGNOSIS — Z23 Encounter for immunization: Secondary | ICD-10-CM

## 2019-07-02 NOTE — Progress Notes (Signed)
   Covid-19 Vaccination Clinic  Name:  Hannah Douglas    MRN: [id number] DOB: 13-Oct-1985  07/02/2019  Hannah Douglas was observed post Covid-19 immunization for 15 minutes without incident. She was provided with Vaccine Information Sheet and instruction to access the V-Safe system.   Hannah Douglas was instructed to call 911 with any severe reactions post vaccine: Marland Kitchen Difficulty breathing  . Swelling of face and throat  . A fast heartbeat  . A bad rash all over body  . Dizziness and weakness   Immunizations Administered    Name Date Dose VIS Date Route   Moderna COVID-19 Vaccine 07/02/2019 10:25 AM 0.5 mL 02/25/2019 Intramuscular   Manufacturer: Gala Murdoch   Lot: [id number]   NDC: 08022-336-12

## 2019-07-14 ENCOUNTER — Encounter: Payer: Self-pay | Admitting: Family Medicine

## 2019-07-14 MED ORDER — LEVOTHYROXINE SODIUM 75 MCG PO TABS: 75.0000 ug | ORAL_TABLET | Freq: Every day | ORAL | 1 refills | Status: DC

## 2019-08-19 ENCOUNTER — Other Ambulatory Visit: Payer: Self-pay

## 2019-08-20 ENCOUNTER — Ambulatory Visit (INDEPENDENT_AMBULATORY_CARE_PROVIDER_SITE_OTHER): Payer: 59 | Admitting: Family Medicine

## 2019-08-20 ENCOUNTER — Encounter: Payer: Self-pay | Admitting: Family Medicine

## 2019-08-20 VITALS — BP 102/84 | HR 76 | Temp 96.4°F | Ht 65.0 in | Wt 230.8 lb

## 2019-08-20 DIAGNOSIS — L732 Hidradenitis suppurativa: Secondary | ICD-10-CM | POA: Diagnosis not present

## 2019-08-20 DIAGNOSIS — E039 Hypothyroidism, unspecified: Secondary | ICD-10-CM

## 2019-08-20 LAB — T3, FREE: T3, Free: 3.5 pg/mL (ref 2.3–4.2)

## 2019-08-20 LAB — TSH: TSH: 2.53 u[IU]/mL (ref 0.35–4.50)

## 2019-08-20 NOTE — Progress Notes (Signed)
Hannah Douglas DOB: 02-22-1986 Encounter date: 08/20/2019  This is a 34 y.o. female who presents with Chief Complaint  Patient presents with  . Follow-up    History of present illness: Derm thinks that changes on chest were seborrheic dermatitis. 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 Now getting to point where she is getting flare ups. Has had steroid injections as well. Currently on doxycycline for one area which flared up.  They have suggested Humira as this has a new medication for hidradenitis suppurativa.  She is wondering about implications of this medication.  Not feeling much more energetic with thyroid med increase. Hair feels thinner. She still feels more run down. Still frustrated with weight. Some brain fog.  She is wondering about starting a low inflammatory diet.   No Known Allergies Current Meds  Medication Sig  . FLUoxetine (PROZAC) 40 MG capsule Take 40 mg by mouth daily.  Marland Kitchen levothyroxine (SYNTHROID) 75 MCG tablet Take 75 mcg by mouth daily before breakfast.  . levothyroxine (SYNTHROID) 75 MCG tablet Take 1 tablet (75 mcg total) by mouth daily.  . Multiple Vitamin (MULTIVITAMIN) tablet Take 1 tablet by mouth daily.  . Omega-3 Fatty Acids (FISH OIL) 1000 MG CAPS Take by mouth.    Review of Systems  Constitutional: Positive for fatigue. Negative for chills and fever.  Respiratory: Negative for cough, chest tightness, shortness of breath and wheezing.   Cardiovascular: Negative for chest pain, palpitations and leg swelling.  Skin:       Multiple infections on the breast (see HPI).  Thinning hair.    Objective:  BP 102/84 (BP Location: Left Arm, Patient Position: Sitting, Cuff Size: Large)   Pulse 76   Temp (!) 96.4 F (35.8 C) (Temporal)   Ht 5\' 5"  (1.651 m)   Wt 230 lb 12.8 oz (104.7 kg)   LMP 08/11/2019 (Exact Date)   BMI 38.41 kg/m   Weight: 230 lb 12.8 oz (104.7 kg)   BP Readings from Last 3 Encounters:  08/20/19 102/84  02/11/19 136/90   07/23/17 100/78   Wt Readings from Last 3 Encounters:  08/20/19 230 lb 12.8 oz (104.7 kg)  02/11/19 230 lb (104.3 kg)  07/23/17 232 lb 9.6 oz (105.5 kg)    Physical Exam Constitutional:      General: She is not in acute distress.    Appearance: She is well-developed and overweight.  Cardiovascular:     Rate and Rhythm: Normal rate and regular rhythm.     Heart sounds: Normal heart sounds. No murmur. No friction rub.  Pulmonary:     Effort: Pulmonary effort is normal. No respiratory distress.     Breath sounds: Normal breath sounds. No wheezing or rales.  Musculoskeletal:     Right lower leg: No edema.     Left lower leg: No edema.  Skin:    Comments: Skin on the breast is irritated and in different phases of healing with some scar tissue.    Neurological:     Mental Status: She is alert and oriented to person, place, and time.  Psychiatric:        Behavior: Behavior normal.     Assessment/Plan  1. Hypothyroidism, unspecified type We discussed trying to keep patient within the low range of normal of her thyroid since she is feeling sluggish, having issues with weight loss, having issues with brain fog.  If we can get her to feel better within his normal range, we did discuss referral to endocrinology for further  management. - TSH; Future - T3, free; Future - T3, free - TSH  2. Hidradenitis suppurativa She is following with dermatology.  We discussed Humira versus current treatment.  Her dermatologist is working with her very closely, bringing her in regularly for steroid injection treatment and having her on doxycycline to help keep down infection.  We discussed pros and cons of being on an immune suppressing medication.   We additionally discussed low inflammatory diet.  She notes that this likely will not clear up skin, but she is wondering if it will help her with feeling better overall.  I encouraged her to try this since she is motivated to do so.  We discussed  benefits including help with cholesterol, benefits to heart health.  Return for pending lab results.    Micheline Rough, MD

## 2019-08-21 DIAGNOSIS — L732 Hidradenitis suppurativa: Secondary | ICD-10-CM | POA: Insufficient documentation

## 2019-08-21 MED ORDER — LEVOTHYROXINE SODIUM 100 MCG PO TABS: ORAL_TABLET | ORAL | 1 refills | Status: DC

## 2019-08-21 MED ORDER — LEVOTHYROXINE SODIUM 75 MCG PO TABS
ORAL_TABLET | ORAL | 1 refills | Status: DC
Start: 2019-08-21 — End: 2020-06-07

## 2019-08-21 NOTE — Addendum Note (Signed)
Addended by: Johnella Moloney on: 08/21/2019 04:57 PM   Modules accepted: Orders

## 2019-09-15 ENCOUNTER — Other Ambulatory Visit: Payer: Self-pay | Admitting: Family Medicine

## 2019-10-11 ENCOUNTER — Other Ambulatory Visit: Payer: Self-pay | Admitting: Family Medicine

## 2019-10-27 ENCOUNTER — Other Ambulatory Visit: Payer: Self-pay

## 2019-10-27 DIAGNOSIS — E039 Hypothyroidism, unspecified: Secondary | ICD-10-CM

## 2019-10-27 NOTE — Progress Notes (Signed)
Entered TSH correctly for lab visit to [facility]/thx dmf

## 2019-11-06 ENCOUNTER — Telehealth: Payer: Self-pay | Admitting: Family Medicine

## 2019-11-06 NOTE — Telephone Encounter (Signed)
Health Review Visit Guide to be signed- placed in dr's folder.  Call [phone] upon completion. 

## 2019-11-10 ENCOUNTER — Ambulatory Visit
Admission: EM | Admit: 2019-11-10 | Discharge: 2019-11-10 | Disposition: A | Discharge status: Home / Self Care | Payer: 59 | Attending: Physician Assistant | Admitting: Physician Assistant

## 2019-11-10 ENCOUNTER — Ambulatory Visit (INDEPENDENT_AMBULATORY_CARE_PROVIDER_SITE_OTHER): Payer: 59

## 2019-11-10 DIAGNOSIS — J209 Acute bronchitis, unspecified: Secondary | ICD-10-CM | POA: Diagnosis not present

## 2019-11-10 DIAGNOSIS — R05 Cough: Secondary | ICD-10-CM

## 2019-11-10 MED ORDER — ALBUTEROL SULFATE HFA 108 (90 BASE) MCG/ACT IN AERS: 1.0000 | INHALATION_SPRAY | Freq: Four times a day (QID) | RESPIRATORY_TRACT | 0 refills | Status: DC | PRN

## 2019-11-10 MED ORDER — PREDNISONE 50 MG PO TABS
50.0000 mg | ORAL_TABLET | Freq: Every day | ORAL | 0 refills | Status: DC
Start: 2019-11-10 — End: 2020-02-04

## 2019-11-10 MED ORDER — DOXYCYCLINE HYCLATE 100 MG PO CAPS
100.0000 mg | ORAL_CAPSULE | Freq: Two times a day (BID) | ORAL | 0 refills | Status: DC
Start: 2019-11-10 — End: 2020-02-04

## 2019-11-10 NOTE — Discharge Instructions (Signed)
Chest xray negative for pneumonia. Start prednisone and doxycycline as directed, this will cover for sinus infection as well as atypical bacterial infection to the lungs. Albuterol as needed. You can take over the counter flonase/nasacort to help with nasal congestion/drainage. Tylenol/motrin for pain and fever. Keep hydrated, urine should be clear to pale yellow in color. If experiencing shortness of breath, trouble breathing, go to the emergency department for further evaluation needed.

## 2019-11-10 NOTE — ED Provider Notes (Signed)
[facility]    CSN: [id number] Arrival date & time: 11/10/19  1308      History   Chief Complaint Chief Complaint  Patient presents with  . chest congestion    HPI Hannah Douglas is a 34 y.o. female.   34 year old female comes in for worsening URI symptoms. States started with cough, congestion x 1 month with negative COVID. Symptoms improved in the past weeks, but for the past 3 days, has had worsening cough, chest tightness, dyspnea on exertion. Also with fatigue, nasal congestion, headache. Denies fever. Never smoker.      Past Medical History:  Diagnosis Date  . Anxiety   . Depression   . Hypothyroidism     Patient Active Problem List   Diagnosis Date Noted  . Hidradenitis suppurativa 08/21/2019  . Screening breast examination 02/11/2019  . Hypothyroidism 12/03/2013  . Obesity, unspecified 12/03/2013    Past Surgical History:  Procedure Laterality Date  . NO PAST SURGERIES      OB History    Gravida  3   Para  3   Term  2   Preterm  1   AB      Living  3     SAB      TAB      Ectopic      Multiple  0   Live Births  3          Home Medications    Prior to Admission medications   Medication Sig Start Date End Date Taking? Authorizing Provider  albuterol (VENTOLIN HFA) 108 (90 Base) MCG/ACT inhaler Inhale 1-2 puffs into the lungs every 6 (six) hours as needed for wheezing or shortness of breath. 11/10/19   Cathie Hoops, Shaela Boer V, PA-C  doxycycline (VIBRAMYCIN) 100 MG capsule Take 1 capsule (100 mg total) by mouth 2 (two) times daily. 11/10/19   Cathie Hoops, Davanta Meuser V, PA-C  FLUoxetine (PROZAC) 40 MG capsule Take 40 mg by mouth daily. 05/05/19   [provider]  levothyroxine (SYNTHROID) 100 MCG tablet Take 1 tablet by mouth once a day 3 days a week 08/21/19   Wynn Banker, MD  levothyroxine (SYNTHROID) 75 MCG tablet Take 1 tablet (75 mcg total) by mouth daily. 07/14/19   Wynn Banker, MD  levothyroxine (SYNTHROID) 75 MCG tablet  Take 1 tablet by mouth once a day 4 days a week 08/21/19   Wynn Banker, MD  Multiple Vitamin (MULTIVITAMIN) tablet Take 1 tablet by mouth daily.    [provider]  Omega-3 Fatty Acids (FISH OIL) 1000 MG CAPS Take by mouth.    [provider]  predniSONE (DELTASONE) 50 MG tablet Take 1 tablet (50 mg total) by mouth daily with breakfast. 11/10/19   Belinda Fisher, PA-C   Family History Family History  Problem Relation Age of Onset  . Lung disease Father 32       idiopathic pulmonary fibrosis  . Cancer Maternal Grandmother        unknown type  . Lung cancer Maternal Grandfather   . Breast cancer Paternal Grandmother   . Lung cancer Paternal Grandfather   . Healthy Mother   . Healthy Brother   . Healthy Brother   . Alcohol abuse Neg Hx   . Arthritis Neg Hx   . Asthma Neg Hx   . Birth defects Neg Hx   . COPD Neg Hx   . Depression Neg Hx   . Diabetes Neg Hx   .  Drug abuse Neg Hx   . Early death Neg Hx   . Hearing loss Neg Hx   . Heart disease Neg Hx   . Hyperlipidemia Neg Hx   . Hypertension Neg Hx   . Kidney disease Neg Hx   . Learning disabilities Neg Hx   . Mental illness Neg Hx   . Mental retardation Neg Hx   . Miscarriages / Stillbirths Neg Hx   . Stroke Neg Hx   . Vision loss Neg Hx   . Varicose Veins Neg Hx     Social History Social History   Tobacco Use  . Smoking status: Never Smoker  . Smokeless tobacco: Never Used  Vaping Use  . Vaping Use: Never used  Substance Use Topics  . Alcohol use: Yes    Comment: occ  . Drug use: No     Allergies   Patient has no known allergies.   Review of Systems Review of Systems  Reason unable to perform ROS: See HPI as above.     Physical Exam Triage Vital Signs ED Triage Vitals  Enc Vitals Group     BP 11/10/19 1359 (!) 131/91     Pulse Rate 11/10/19 1359 (!) 111     Resp 11/10/19 1359 18     Temp 11/10/19 1359 98.3 F (36.8 C)     Temp Source 11/10/19 1359 Oral     SpO2 11/10/19 1359  97 %     Weight --      Height --      Head Circumference --      Peak Flow --      Pain Score 11/10/19 1436 3     Pain Loc --      Pain Edu? --      Excl. in GC? --    No data found.  Updated Vital Signs BP (!) 131/91 (BP Location: Left Arm)   Pulse (!) 111   Temp 98.3 F (36.8 C) (Oral)   Resp 18   LMP 10/30/2019   SpO2 97%   Breastfeeding No   Physical Exam Constitutional:      General: She is not in acute distress.    Appearance: Normal appearance. She is not ill-appearing, toxic-appearing or diaphoretic.  HENT:     Head: Normocephalic and atraumatic.     Nose:     Right Sinus: No maxillary sinus tenderness or frontal sinus tenderness.     Left Sinus: No maxillary sinus tenderness or frontal sinus tenderness.     Mouth/Throat:     Mouth: Mucous membranes are moist.     Pharynx: Oropharynx is clear. Uvula midline.  Cardiovascular:     Rate and Rhythm: Normal rate and regular rhythm.     Heart sounds: Normal heart sounds. No murmur heard.  No friction rub. No gallop.   Pulmonary:     Effort: Pulmonary effort is normal. No accessory muscle usage, prolonged expiration, respiratory distress or retractions.     Comments: Lungs clear to auscultation without adventitious lung sounds. Chest:     Comments: Chest tenderness to palpation.  Musculoskeletal:     Cervical back: Normal range of motion and neck supple.  Neurological:     General: No focal deficit present.     Mental Status: She is alert and oriented to person, place, and time.      UC Treatments / Results  Labs (all labs ordered are listed, but only abnormal results are displayed) Labs Reviewed  NOVEL CORONAVIRUS,  NAA    EKG   Radiology DG Chest 2 View  Result Date: 11/10/2019 CLINICAL DATA:  Cough x1 month. EXAM: CHEST - 2 VIEW COMPARISON:  January 18, 2017 FINDINGS: Mild, stable linear scarring is seen within the right lung base. There is no evidence of acute infiltrate, pleural effusion or  pneumothorax. The heart size and mediastinal contours are within normal limits. The visualized skeletal structures are unremarkable. IMPRESSION: No active cardiopulmonary disease. Electronically Signed   By: Aram Candela M.D.   On: 11/10/2019 15:11    Procedures Procedures (including critical care time)  Medications Ordered in UC Medications - No data to display  Initial Impression / Assessment and Plan / UC Course  I have reviewed the triage vital signs and the nursing notes.  Pertinent labs & imaging results that were available during my care of the patient were reviewed by me and considered in my medical decision making (see chart for details).    Chest x-ray negative for active cardiopulmonary disease.  Patient afebrile, nontoxic.  Lungs clear to auscultation bilaterally with adventitious lung sounds.  Will treat for bronchitis with prednisone and doxycycline.  Other symptomatic treatment discussed.  Return precautions given.    Final Clinical Impressions(s) / UC Diagnoses   Final diagnoses:  Acute bronchitis, unspecified organism    ED Prescriptions    Medication Sig Dispense Auth. Provider   doxycycline (VIBRAMYCIN) 100 MG capsule Take 1 capsule (100 mg total) by mouth 2 (two) times daily. 14 capsule Livi Mcgann V, PA-C   predniSONE (DELTASONE) 50 MG tablet Take 1 tablet (50 mg total) by mouth daily with breakfast. 5 tablet Sidney Silberman V, PA-C   albuterol (VENTOLIN HFA) 108 (90 Base) MCG/ACT inhaler Inhale 1-2 puffs into the lungs every 6 (six) hours as needed for wheezing or shortness of breath. 8 g Belinda Fisher, PA-C     PDMP not reviewed this encounter.   Belinda Fisher, PA-C 11/10/19 1544

## 2019-11-10 NOTE — Telephone Encounter (Signed)
Returned to you 

## 2019-11-10 NOTE — Telephone Encounter (Signed)
Left a detailed message at the pts cell number the forms was completed and left at the front desk for pick up.  Copy sent to be scanned.

## 2019-11-10 NOTE — ED Triage Notes (Signed)
Pt c/o cough and nasal congestion for a month with a neg covid test. States her sx's are now coming back and getting worse over the past 3 days. States today having chest congestion and tightness with SOB on exertion.

## 2019-11-11 LAB — NOVEL CORONAVIRUS, NAA: SARS-CoV-2, NAA: NOT DETECTED

## 2019-11-11 LAB — SARS-COV-2, NAA 2 DAY TAT

## 2019-11-24 ENCOUNTER — Other Ambulatory Visit: Payer: 59

## 2019-11-25 ENCOUNTER — Ambulatory Visit (INDEPENDENT_AMBULATORY_CARE_PROVIDER_SITE_OTHER): Payer: 59 | Admitting: Psychology

## 2019-11-25 DIAGNOSIS — F419 Anxiety disorder, unspecified: Secondary | ICD-10-CM

## 2019-11-28 ENCOUNTER — Other Ambulatory Visit: Payer: Self-pay

## 2019-11-28 ENCOUNTER — Other Ambulatory Visit (INDEPENDENT_AMBULATORY_CARE_PROVIDER_SITE_OTHER): Payer: 59

## 2019-11-28 DIAGNOSIS — E039 Hypothyroidism, unspecified: Secondary | ICD-10-CM | POA: Diagnosis not present

## 2019-11-29 LAB — TSH: TSH: 1.49 mIU/L

## 2019-12-08 ENCOUNTER — Ambulatory Visit (INDEPENDENT_AMBULATORY_CARE_PROVIDER_SITE_OTHER): Payer: 59 | Admitting: Psychology

## 2019-12-08 DIAGNOSIS — F419 Anxiety disorder, unspecified: Secondary | ICD-10-CM

## 2019-12-25 ENCOUNTER — Ambulatory Visit (INDEPENDENT_AMBULATORY_CARE_PROVIDER_SITE_OTHER): Payer: 59 | Admitting: Psychology

## 2019-12-25 DIAGNOSIS — F419 Anxiety disorder, unspecified: Secondary | ICD-10-CM | POA: Diagnosis not present

## 2020-01-07 ENCOUNTER — Ambulatory Visit (INDEPENDENT_AMBULATORY_CARE_PROVIDER_SITE_OTHER): Payer: 59 | Admitting: Psychology

## 2020-01-07 DIAGNOSIS — F419 Anxiety disorder, unspecified: Secondary | ICD-10-CM

## 2020-01-21 ENCOUNTER — Ambulatory Visit (INDEPENDENT_AMBULATORY_CARE_PROVIDER_SITE_OTHER): Payer: 59 | Admitting: Psychology

## 2020-01-21 DIAGNOSIS — F419 Anxiety disorder, unspecified: Secondary | ICD-10-CM

## 2020-01-27 ENCOUNTER — Encounter: Payer: Self-pay | Admitting: Family Medicine

## 2020-02-04 ENCOUNTER — Ambulatory Visit (INDEPENDENT_AMBULATORY_CARE_PROVIDER_SITE_OTHER): Payer: 59 | Admitting: Psychology

## 2020-02-04 ENCOUNTER — Encounter: Payer: Self-pay | Admitting: Family Medicine

## 2020-02-04 ENCOUNTER — Ambulatory Visit: Payer: 59 | Admitting: Family Medicine

## 2020-02-04 ENCOUNTER — Other Ambulatory Visit: Payer: Self-pay

## 2020-02-04 VITALS — BP 100/78 | HR 76 | Temp 98.1°F | Ht 64.75 in | Wt 231.7 lb

## 2020-02-04 DIAGNOSIS — E039 Hypothyroidism, unspecified: Secondary | ICD-10-CM

## 2020-02-04 DIAGNOSIS — F419 Anxiety disorder, unspecified: Secondary | ICD-10-CM | POA: Diagnosis not present

## 2020-02-04 DIAGNOSIS — E538 Deficiency of other specified B group vitamins: Secondary | ICD-10-CM

## 2020-02-04 DIAGNOSIS — Z0000 Encounter for general adult medical examination without abnormal findings: Secondary | ICD-10-CM | POA: Diagnosis not present

## 2020-02-04 DIAGNOSIS — L732 Hidradenitis suppurativa: Secondary | ICD-10-CM | POA: Diagnosis not present

## 2020-02-04 DIAGNOSIS — E559 Vitamin D deficiency, unspecified: Secondary | ICD-10-CM

## 2020-02-04 DIAGNOSIS — R5383 Other fatigue: Secondary | ICD-10-CM

## 2020-02-04 DIAGNOSIS — Z13220 Encounter for screening for lipoid disorders: Secondary | ICD-10-CM

## 2020-02-04 NOTE — Progress Notes (Signed)
Hannah Douglas DOB: 1985-09-08 Encounter date: 02/04/2020  This is a 34 y.o. female who presents for complete physical   History of present illness/Additional concerns: Hidradenitis suppurativa - derm had suggested Humira trial but she has decided not to go through with this. Suggested trying spironolactone - for acne under mask as well as HS. Started that today. 25mg  daily x 1 week and then supposed to increase to BID.   Psychiatrist has added pristiq. Added to the prozac; just wanted to make sure these were ok to take together.    Past Medical History:  Diagnosis Date  . Anxiety   . Depression   . Hypothyroidism    Past Surgical History:  Procedure Laterality Date  . NO PAST SURGERIES     No Known Allergies Current Meds  Medication Sig  . Desvenlafaxine Succinate ER 25 MG TB24 daily.   FLUoxetine (PROZAC) 40 MG capsule Take 40 mg by mouth daily.  Marland Kitchen levothyroxine (SYNTHROID) 100 MCG tablet Take 1 tablet by mouth once a day 3 days a week  . levothyroxine (SYNTHROID) 75 MCG tablet Take 1 tablet by mouth once a day 4 days a week  . Multiple Vitamin (MULTIVITAMIN) tablet Take 1 tablet by mouth daily.  . Omega-3 Fatty Acids (FISH OIL) 1000 MG CAPS Take by mouth.  . spironolactone (ALDACTONE) 50 MG tablet Take 50 mg by mouth 2 (two) times daily.   Social History   Tobacco Use  . Smoking status: Never Smoker  . Smokeless tobacco: Never Used  Substance Use Topics  . Alcohol use: Yes    Comment: occ   Family History  Problem Relation Age of Onset  . Lung disease Father 98       idiopathic pulmonary fibrosis  . Cancer Maternal Grandmother        unknown type  . Lung cancer Maternal Grandfather   . Breast cancer Paternal Grandmother   . Lung cancer Paternal Grandfather   . Healthy Mother   . Healthy Brother   . Healthy Brother   . Alcohol abuse Neg Hx   . Arthritis Neg Hx   . Asthma Neg Hx   . Birth defects Neg Hx   . COPD Neg Hx   . Depression Neg Hx   .  Diabetes Neg Hx   . Drug abuse Neg Hx   . Early death Neg Hx   . Hearing loss Neg Hx   . Heart disease Neg Hx   . Hyperlipidemia Neg Hx   . Hypertension Neg Hx   . Kidney disease Neg Hx   . Learning disabilities Neg Hx   . Mental illness Neg Hx   . Mental retardation Neg Hx   . Miscarriages / Stillbirths Neg Hx   . Stroke Neg Hx   . Vision loss Neg Hx   . Varicose Veins Neg Hx      Review of Systems  Constitutional: Negative for activity change, appetite change, chills, fatigue, fever and unexpected weight change.  HENT: Negative for congestion, ear pain, hearing loss, sinus pressure, sinus pain, sore throat and trouble swallowing.   Eyes: Negative for pain and visual disturbance.  Respiratory: Negative for cough, chest tightness, shortness of breath and wheezing.   Cardiovascular: Negative for chest pain, palpitations and leg swelling.  Gastrointestinal: Negative for abdominal pain, blood in stool, constipation, diarrhea, nausea and vomiting.  Genitourinary: Negative for difficulty urinating and menstrual problem.  Musculoskeletal: Negative for arthralgias and back pain.  Skin: Negative for rash.  Still getting flare ups of hidradenitis. She calls derm and gets steroid injection in lesion which helps most of the time.   Neurological: Negative for dizziness, weakness, numbness and headaches.  Hematological: Negative for adenopathy. Does not bruise/bleed easily.  Psychiatric/Behavioral: Negative for sleep disturbance and suicidal ideas. The patient is not nervous/anxious.     CBC:  Lab Results  Component Value Date   WBC 15.8 (H) 08/18/2016   HGB 12.6 08/18/2016   HCT 36.2 08/18/2016   MCH 29.2 08/18/2016   MCHC 34.8 08/18/2016   RDW 14.6 08/18/2016   PLT 281 08/18/2016   CMP:No results found for: NA, K, CL, CO2, ANIONGAP, GLUCOSE, BUN, CREATININE, LABGLOB, GFRAA, CALCIUM, PROT, AGRATIO, BILITOT, ALKPHOS, ALT, AST, GLOB LIPID: Lab Results  Component Value Date    CHOL 180 06/09/2019   TRIG 152.0 (H) 06/09/2019   HDL 38.90 (L) 06/09/2019   LDLCALC 111 (H) 06/09/2019    Objective:  BP 100/78 (BP Location: Left Arm, Patient Position: Sitting, Cuff Size: Large)   Pulse 76   Temp 98.1 F (36.7 C) (Oral)   Ht 5' 4.75" (1.645 m)   Wt 231 lb 11.2 oz (105.1 kg)   LMP 01/20/2020 (Exact Date)   BMI 38.86 kg/m   Weight: 231 lb 11.2 oz (105.1 kg)   BP Readings from Last 3 Encounters:  02/04/20 100/78  11/10/19 (!) 131/91  08/20/19 102/84   Wt Readings from Last 3 Encounters:  02/04/20 231 lb 11.2 oz (105.1 kg)  08/20/19 230 lb 12.8 oz (104.7 kg)  02/11/19 230 lb (104.3 kg)    Physical Exam Constitutional:      General: She is not in acute distress.    Appearance: She is well-developed.  HENT:     Head: Normocephalic and atraumatic.     Right Ear: External ear normal.     Left Ear: External ear normal.     Mouth/Throat:     Pharynx: No oropharyngeal exudate.  Eyes:     Conjunctiva/sclera: Conjunctivae normal.     Pupils: Pupils are equal, round, and reactive to light.  Neck:     Thyroid: No thyromegaly.  Cardiovascular:     Rate and Rhythm: Normal rate and regular rhythm.     Heart sounds: Normal heart sounds. No murmur heard.  No friction rub. No gallop.   Pulmonary:     Effort: Pulmonary effort is normal.     Breath sounds: Normal breath sounds.  Abdominal:     General: Bowel sounds are normal. There is no distension.     Palpations: Abdomen is soft. There is no mass.     Tenderness: There is no abdominal tenderness. There is no guarding.     Hernia: No hernia is present.  Musculoskeletal:        General: No tenderness or deformity. Normal range of motion.     Cervical back: Normal range of motion and neck supple.  Lymphadenopathy:     Cervical: No cervical adenopathy.  Skin:    General: Skin is warm and dry.     Findings: No rash.     Comments: Erythematous scars over chest where she has had prior hidradenitis breakouts.   Neurological:     Mental Status: She is alert and oriented to person, place, and time.     Deep Tendon Reflexes: Reflexes normal.     Reflex Scores:      Tricep reflexes are 2+ on the right side and 2+ on the left side.  Bicep reflexes are 2+ on the right side and 2+ on the left side.      Brachioradialis reflexes are 2+ on the right side and 2+ on the left side.      Patellar reflexes are 2+ on the right side and 2+ on the left side. Psychiatric:        Speech: Speech normal.        Behavior: Behavior normal.        Thought Content: Thought content normal.     Assessment/Plan: Health Maintenance Due  Topic Date Due  . Hepatitis C Screening  Never done   Health Maintenance reviewed.  1. Preventative health care We discussed importance of regular exercise.  - Hepatitis C antibody; Future  2. Hidradenitis suppurativa She is following with dermatology.  She continues to consider treatment options through them.  3. Hypothyroidism, unspecified type We will recheck blood work prior to next visit.  Thyroid was stable on most recent blood work and she has done well with levothyroxine. - TSH; Future  4. Vitamin D deficiency Currently taking over-the-counter supplementation.  Doing well with this. - VITAMIN D 25 Hydroxy (Vit-D Deficiency, Fractures); Future  5. B12 deficiency Taking over-the-counter supplementation.  Doing well with this. - Vitamin B12; Future  6. Lipid screening - Lipid panel; Future - Comprehensive metabolic panel; Future  7. Fatigue, unspecified type - CBC with Differential/Platelet; Future  Return in about 6 months (around 08/03/2020) for Chronic condition visit with bloodwork prior.  Theodis Shove, MD

## 2020-02-04 NOTE — Patient Instructions (Addendum)
[email or web address] to schedule covid booster  Let me know how pressures are looking on the spironolactone after a week/before you increase dose if you have concerns with increase.

## 2020-02-23 ENCOUNTER — Other Ambulatory Visit: Payer: Self-pay | Admitting: Family Medicine

## 2020-03-17 ENCOUNTER — Ambulatory Visit (INDEPENDENT_AMBULATORY_CARE_PROVIDER_SITE_OTHER): Payer: 59 | Admitting: Psychology

## 2020-03-17 DIAGNOSIS — F419 Anxiety disorder, unspecified: Secondary | ICD-10-CM | POA: Diagnosis not present

## 2020-03-23 ENCOUNTER — Ambulatory Visit: Payer: 59

## 2020-04-16 ENCOUNTER — Ambulatory Visit: Payer: BC Managed Care – PPO | Admitting: Psychology

## 2020-06-05 ENCOUNTER — Other Ambulatory Visit: Payer: Self-pay | Admitting: Family Medicine

## 2020-07-04 ENCOUNTER — Telehealth: Payer: No Typology Code available for payment source | Admitting: Family

## 2020-07-04 ENCOUNTER — Encounter: Payer: Self-pay | Admitting: Family

## 2020-07-04 DIAGNOSIS — B9689 Other specified bacterial agents as the cause of diseases classified elsewhere: Secondary | ICD-10-CM | POA: Diagnosis not present

## 2020-07-04 DIAGNOSIS — J208 Acute bronchitis due to other specified organisms: Secondary | ICD-10-CM

## 2020-07-04 MED ORDER — PREDNISONE 10 MG (21) PO TBPK: ORAL_TABLET | ORAL | 0 refills | Status: DC

## 2020-07-04 MED ORDER — BENZONATATE 100 MG PO CAPS: 100.0000 mg | ORAL_CAPSULE | Freq: Three times a day (TID) | ORAL | 0 refills | Status: DC | PRN

## 2020-07-04 MED ORDER — AZITHROMYCIN 250 MG PO TABS: ORAL_TABLET | ORAL | 0 refills | Status: DC

## 2020-07-04 NOTE — Progress Notes (Signed)
Virtual Visit  Note Due to COVID-19 pandemic this visit was conducted virtually. This visit type was conducted due to national recommendations for restrictions regarding the COVID-19 Pandemic (e.g. social distancing, sheltering in place) in an effort to limit this patient's exposure and mitigate transmission in our community. All issues noted in this document were discussed and addressed.  A physical exam was not performed with this format.  I connected with Hannah Douglas on 07/04/20 at 2:38 pm  by video and verified that I am speaking with the correct person using two identifiers. Hannah Douglas is currently located at home and no one is currently with her during visit. The provider, Jannifer Rodney, FNP is located in their office at time of visit.  I discussed the limitations, risks, security and privacy concerns of performing an evaluation and management service by video and the availability of in person appointments. I also discussed with the patient that there may be a patient responsible charge related to this service. The patient expressed understanding and agreed to proceed.   History and Present Illness:  PT presents to the office today with cough for the last two weeks. She reports she has taken 3 COVID tests at home that were negative.  Cough This is a new problem. The current episode started 1 to 4 weeks ago. The problem has been waxing and waning. The problem occurs every few minutes. The cough is non-productive. Associated symptoms include nasal congestion, postnasal drip, rhinorrhea, a sore throat and wheezing. Pertinent negatives include no fever or shortness of breath. She has tried rest for the symptoms. The treatment provided mild relief.      Review of Systems  Constitutional: Negative for fever.  HENT: Positive for postnasal drip, rhinorrhea and sore throat.   Respiratory: Positive for cough and wheezing. Negative for shortness of breath.       Observations/Objective: No SOB or distress noted   Assessment and Plan: 1. Acute bacterial bronchitis - Take meds as prescribed - Use a cool mist humidifier  -Use saline nose sprays frequently -Force fluids -For any cough or congestion  Use plain Mucinex- regular strength or max strength is fine -For fever or aces or pains- take tylenol or ibuprofen. -Throat lozenges if help -Follow up with PCP if symptoms worsen or do not improve  - predniSONE (STERAPRED UNI-PAK 21 TAB) 10 MG (21) TBPK tablet; Use as directed  Dispense: 21 tablet; Refill: 0 - azithromycin (ZITHROMAX) 250 MG tablet; Take 500 mg once, then 250 mg for four days  Dispense: 6 tablet; Refill: 0 - benzonatate (TESSALON PERLES) 100 MG capsule; Take 1 capsule (100 mg total) by mouth 3 (three) times daily as needed.  Dispense: 20 capsule; Refill: 0     I discussed the assessment and treatment plan with the patient. The patient was provided an opportunity to ask questions and all were answered. The patient agreed with the plan and demonstrated an understanding of the instructions.   The patient was advised to call back or seek an in-person evaluation if the symptoms worsen or if the condition fails to improve as anticipated.  The above assessment and management plan was discussed with the patient. The patient verbalized understanding of and has agreed to the management plan. Patient is aware to call the clinic if symptoms persist or worsen. Patient is aware when to return to the clinic for a follow-up visit. Patient educated on when it is appropriate to go to the emergency department.   Time call  ended:  2:45 pm   I provided 7 minutes of   face-to-face time during this encounter.    Jannifer Rodney, FNP

## 2020-07-26 ENCOUNTER — Other Ambulatory Visit: Payer: 59

## 2020-07-30 ENCOUNTER — Encounter: Payer: Self-pay | Admitting: Family Medicine

## 2020-08-01 ENCOUNTER — Other Ambulatory Visit: Payer: Self-pay | Admitting: Family Medicine

## 2020-08-02 ENCOUNTER — Other Ambulatory Visit: Payer: Self-pay

## 2020-08-02 ENCOUNTER — Other Ambulatory Visit (INDEPENDENT_AMBULATORY_CARE_PROVIDER_SITE_OTHER): Payer: No Typology Code available for payment source

## 2020-08-02 DIAGNOSIS — Z13220 Encounter for screening for lipoid disorders: Secondary | ICD-10-CM

## 2020-08-02 DIAGNOSIS — R5383 Other fatigue: Secondary | ICD-10-CM

## 2020-08-02 DIAGNOSIS — E538 Deficiency of other specified B group vitamins: Secondary | ICD-10-CM

## 2020-08-02 DIAGNOSIS — E039 Hypothyroidism, unspecified: Secondary | ICD-10-CM

## 2020-08-02 DIAGNOSIS — E559 Vitamin D deficiency, unspecified: Secondary | ICD-10-CM

## 2020-08-02 DIAGNOSIS — Z0000 Encounter for general adult medical examination without abnormal findings: Secondary | ICD-10-CM

## 2020-08-02 LAB — LIPID PANEL
Cholesterol: 168 mg/dL (ref 0–200)
HDL: 35.3 mg/dL — ABNORMAL LOW (ref >39.00)
LDL Cholesterol: 102 mg/dL — ABNORMAL HIGH (ref 0–99)
NonHDL: 132.88
Total CHOL/HDL Ratio: 5
Triglycerides: 156 mg/dL — ABNORMAL HIGH (ref 0.0–149.0)
VLDL: 31.2 mg/dL (ref 0.0–40.0)

## 2020-08-02 LAB — COMPREHENSIVE METABOLIC PANEL
ALT: 32 U/L (ref 0–35)
AST: 19 U/L (ref 0–37)
Albumin: 4.3 g/dL (ref 3.5–5.2)
Alkaline Phosphatase: 52 U/L (ref 39–117)
BUN: 13 mg/dL (ref 6–23)
CO2: 29 mEq/L (ref 19–32)
Calcium: 9.2 mg/dL (ref 8.4–10.5)
Chloride: 102 mEq/L (ref 96–112)
Creatinine, Ser: 0.61 mg/dL (ref 0.40–1.20)
GFR: 114.97 mL/min (ref >60.00)
Glucose, Bld: 91 mg/dL (ref 70–99)
Potassium: 4.3 mEq/L (ref 3.5–5.1)
Sodium: 138 mEq/L (ref 135–145)
Total Bilirubin: 0.5 mg/dL (ref 0.2–1.2)
Total Protein: 6.7 g/dL (ref 6.0–8.3)

## 2020-08-02 LAB — CBC WITH DIFFERENTIAL/PLATELET
Basophils Absolute: 0 10*3/uL (ref 0.0–0.1)
Basophils Relative: 0.4 % (ref 0.0–3.0)
Eosinophils Absolute: 0.1 10*3/uL (ref 0.0–0.7)
Eosinophils Relative: 1 % (ref 0.0–5.0)
HCT: 38.2 % (ref 36.0–46.0)
Hemoglobin: 13.3 g/dL (ref 12.0–15.0)
Lymphocytes Relative: 44.2 % (ref 12.0–46.0)
Lymphs Abs: 2.9 10*3/uL (ref 0.7–4.0)
MCHC: 34.7 g/dL (ref 30.0–36.0)
MCV: 86.7 fl (ref 78.0–100.0)
Monocytes Absolute: 0.4 10*3/uL (ref 0.1–1.0)
Monocytes Relative: 6 % (ref 3.0–12.0)
Neutro Abs: 3.1 10*3/uL (ref 1.4–7.7)
Neutrophils Relative %: 48.4 % (ref 43.0–77.0)
Platelets: 286 10*3/uL (ref 150.0–400.0)
RBC: 4.41 Mil/uL (ref 3.87–5.11)
RDW: 15 % (ref 11.5–15.5)
WBC: 6.5 10*3/uL (ref 4.0–10.5)

## 2020-08-02 LAB — VITAMIN B12: Vitamin B-12: 583 pg/mL (ref 211–911)

## 2020-08-02 LAB — TSH: TSH: 4.03 u[IU]/mL (ref 0.35–4.50)

## 2020-08-02 LAB — VITAMIN D 25 HYDROXY (VIT D DEFICIENCY, FRACTURES): VITD: 22.01 ng/mL — ABNORMAL LOW (ref 30.00–100.00)

## 2020-08-02 NOTE — Addendum Note (Signed)
Addended by: Bonnye Fava on: 08/02/2020 07:41 AM   Modules accepted: Orders

## 2020-08-03 LAB — HEPATITIS C ANTIBODY
Hepatitis C Ab: NONREACTIVE
SIGNAL TO CUT-OFF: 0.01 (ref <1.00)

## 2020-08-04 ENCOUNTER — Encounter: Payer: Self-pay | Admitting: Family Medicine

## 2020-08-04 ENCOUNTER — Other Ambulatory Visit: Payer: Self-pay

## 2020-08-04 ENCOUNTER — Ambulatory Visit (INDEPENDENT_AMBULATORY_CARE_PROVIDER_SITE_OTHER): Payer: No Typology Code available for payment source | Admitting: Family Medicine

## 2020-08-04 VITALS — BP 104/72 | HR 96 | Temp 98.0°F | Ht 64.75 in | Wt 245.2 lb

## 2020-08-04 DIAGNOSIS — L732 Hidradenitis suppurativa: Secondary | ICD-10-CM | POA: Diagnosis not present

## 2020-08-04 DIAGNOSIS — E039 Hypothyroidism, unspecified: Secondary | ICD-10-CM

## 2020-08-04 DIAGNOSIS — E559 Vitamin D deficiency, unspecified: Secondary | ICD-10-CM

## 2020-08-04 DIAGNOSIS — R5383 Other fatigue: Secondary | ICD-10-CM | POA: Diagnosis not present

## 2020-08-04 DIAGNOSIS — R635 Abnormal weight gain: Secondary | ICD-10-CM

## 2020-08-04 MED ORDER — VITAMIN D (ERGOCALCIFEROL) 1.25 MG (50000 UNIT) PO CAPS
50000.0000 [IU] | ORAL_CAPSULE | ORAL | 1 refills | Status: DC
Start: 2020-08-04 — End: 2021-08-05
  Filled 2020-10-06: qty 4, 28d supply, fill #0

## 2020-08-04 MED ORDER — LEVOTHYROXINE SODIUM 100 MCG PO TABS
ORAL_TABLET | ORAL | 0 refills | Status: DC
  Filled 2020-10-06: qty 24, 84d supply, fill #0

## 2020-08-04 MED ORDER — LEVOTHYROXINE SODIUM 75 MCG PO TABS
ORAL_TABLET | ORAL | 1 refills | Status: DC
  Filled 2020-10-06: qty 48, 84d supply, fill #0

## 2020-08-04 NOTE — Progress Notes (Signed)
Hannah Douglas DOB: 06-01-1985 Encounter date: 08/04/2020  This is a 35 y.o. female who presents with Chief Complaint  Patient presents with  . Follow-up    History of present illness: Last visit with me was 02/04/2020.  That was for physical.  Her last visit, she had to started spironolactone with hopes that it would help with acne as well as hidradenitis suppurativa. Has helped with both skin issues.   Taking synthroid 3 days/week and 4 days/week. Energy level is still poor.  Walking regularly; staying hydrated. Sleeping ok. Getting enough hours (8-9) but still feels a little tired. Very drained by end of day.   Past month - more eating out - just sold house; living at [facility]. Actually felt like she was doing well with eating and regular exercise.   No Known Allergies Current Meds  Medication Sig  . FLUoxetine (PROZAC) 40 MG capsule Take 40 mg by mouth daily.  . Multiple Vitamin (MULTIVITAMIN) tablet Take 1 tablet by mouth daily.  . Omega-3 Fatty Acids (FISH OIL) 1000 MG CAPS Take by mouth.  . spironolactone (ALDACTONE) 50 MG tablet Take 50 mg by mouth 2 (two) times daily.  . Vitamin D, Ergocalciferol, (DRISDOL) 1.25 MG (50000 UNIT) CAPS capsule Take 1 capsule (50,000 Units total) by mouth every 7 (seven) days.  . [DISCONTINUED] levothyroxine (SYNTHROID) 100 MCG tablet TAKE 1 TABLET BY MOUTH ONCE A DAY 3 DAYS A WEEK  . [DISCONTINUED] levothyroxine (SYNTHROID) 75 MCG tablet TAKE 1 TABLET BY MOUTH ONCE A DAY 4 DAYS A WEEK    Review of Systems  Constitutional: Negative for chills, fatigue and fever.  Respiratory: Negative for cough, chest tightness, shortness of breath and wheezing.   Cardiovascular: Negative for chest pain, palpitations and leg swelling.    Objective:  BP 104/72 (BP Location: Left Arm, Patient Position: Sitting, Cuff Size: Large)   Pulse 96   Temp 98 F (36.7 C) (Oral)   Ht 5' 4.75" (1.645 m)   Wt 245 lb 3.2 oz (111.2 kg)   LMP  07/22/2020 (Approximate)   SpO2 97%   BMI 41.12 kg/m   Weight: 245 lb 3.2 oz (111.2 kg)   BP Readings from Last 3 Encounters:  08/04/20 104/72  02/04/20 100/78  11/10/19 (!) 131/91   Wt Readings from Last 3 Encounters:  08/04/20 245 lb 3.2 oz (111.2 kg)  02/04/20 231 lb 11.2 oz (105.1 kg)  08/20/19 230 lb 12.8 oz (104.7 kg)    Physical Exam Constitutional:      General: She is not in acute distress.    Appearance: She is well-developed.  Cardiovascular:     Rate and Rhythm: Normal rate and regular rhythm.     Heart sounds: Normal heart sounds. No murmur heard. No friction rub.  Pulmonary:     Effort: Pulmonary effort is normal. No respiratory distress.     Breath sounds: Normal breath sounds. No wheezing or rales.  Musculoskeletal:     Right lower leg: No edema.     Left lower leg: No edema.  Neurological:     Mental Status: She is alert and oriented to person, place, and time.  Psychiatric:        Behavior: Behavior normal.     Assessment/Plan  1. Hypothyroidism, unspecified type We are going to increase her Synthroid to 100 mcg 4 days a week and 75 mg 3 days a week.  Although her TSH was in normal, she was in high normal range and  we are hoping to improve her energy level by getting her more to the lower normal range.  Would consider adding in T3 in the future as a child for energy level. - TSH; Future - T4, free; Future - T3, free; Future  2. Hidradenitis suppurativa She follows regularly with dermatology for this and gets steroid injections and new lesions as they arise.  Spironolactone does seem to be helping with the amount of new lesions.  3. Weight gain She is doing a great job with regular exercise.  We talked about higher intensity interval training.  She does feel that she will do better with a specific plan for eating, so we are going to refer to nutrition for this.  We did discuss consideration of medications like GLP-1 to help with weight loss.  I will  see how she does with dietitian first. - Amb ref to Medical Nutrition Therapy-MNT  4.  Fatigue Hopeful that adjustment in thyroid medication as well as vitamin D will help with energy level.  5.  D deficiency We are going to give another burst of 50,000 units weekly to try to boost vitamin D levels.  Then can continue with over-the-counter supplementation.   Return for bloodwork in 8 weeks.     Theodis Shove, MD

## 2020-08-04 NOTE — Patient Instructions (Addendum)
Archie: laura jobe, donetta floyd, leonard (names for dieticians though [place])

## 2020-08-07 IMAGING — MG DIGITAL DIAGNOSTIC BILAT W/ TOMO W/ CAD
6 of 10 series · 6 of 30 positions shown · non-contrast
Comparison: None.

CLINICAL DATA: 33-year-old female presenting for evaluation of skin
discoloration superior to the right nipple which she noticed about 6
weeks ago. Relatively recently, she has started to develop keloids
on her breasts bilaterally, which now involve her upper abdomen,
with no clear cause. She states that this skin change above her
right nipple is different than the way that the other keloids have
developed.

EXAM:
DIGITAL DIAGNOSTIC BILATERAL MAMMOGRAM WITH CAD AND TOMO
ULTRASOUND RIGHT BREAST

[L CC synth-2D]
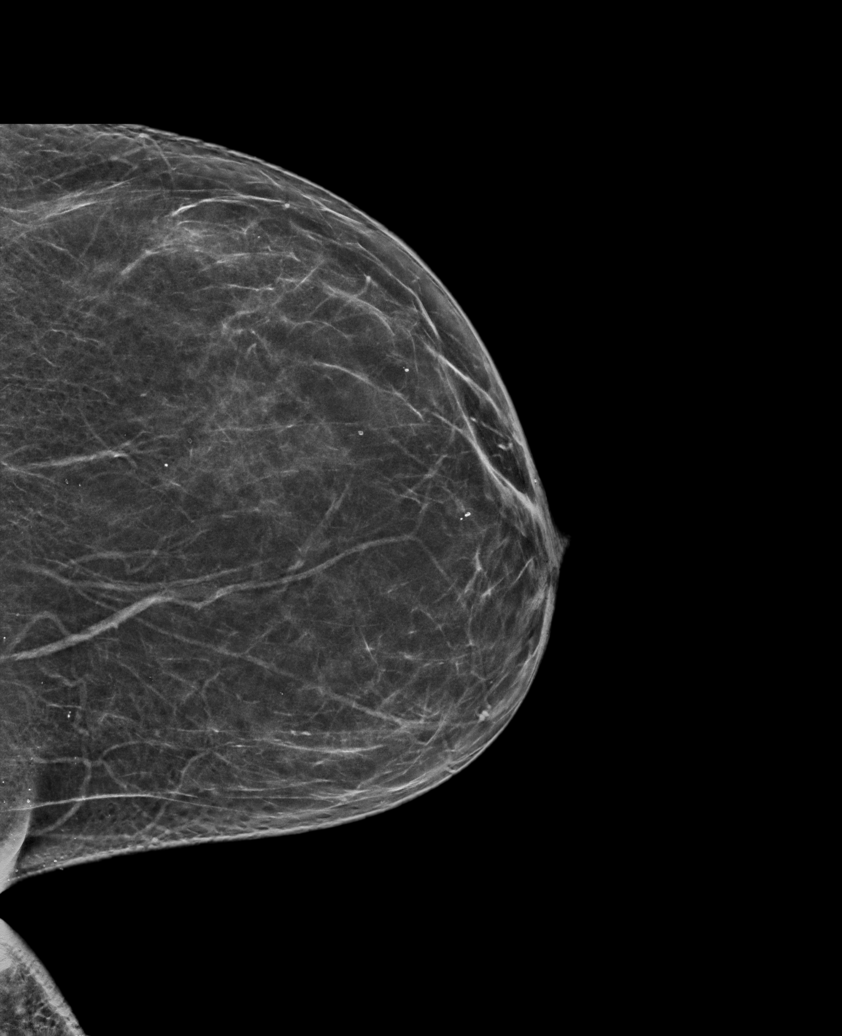

[L MLO synth-2D]
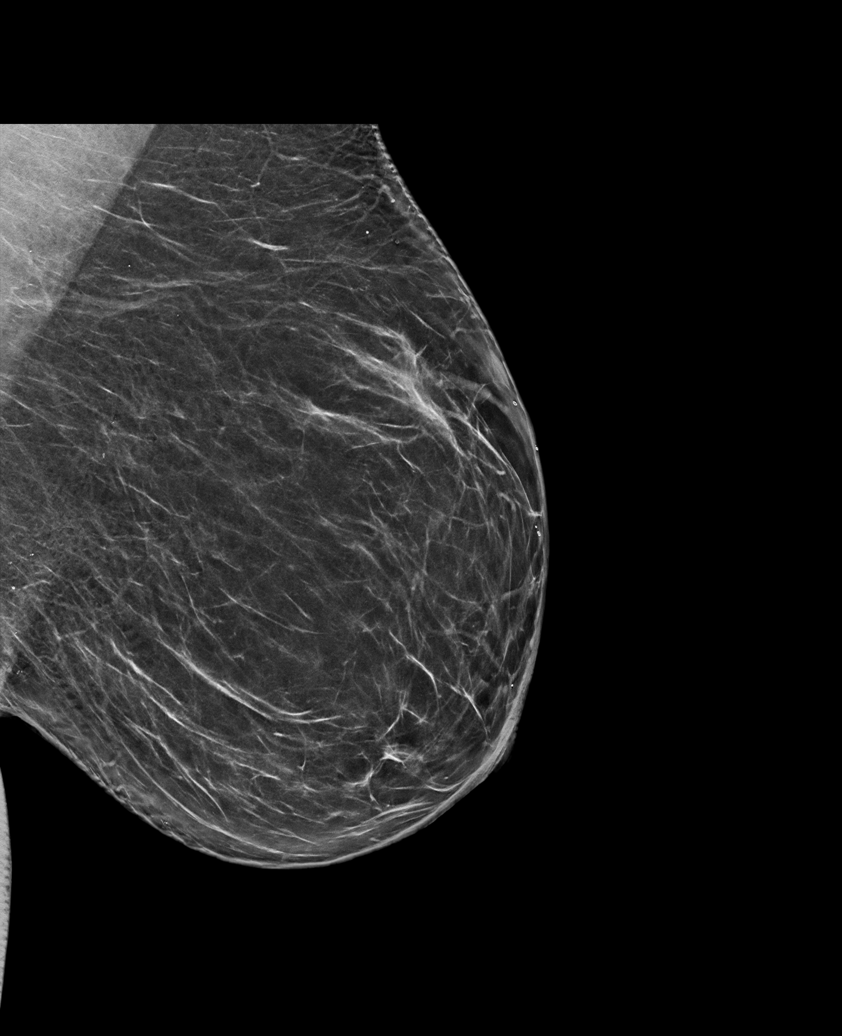

[R TAN synth-2D]
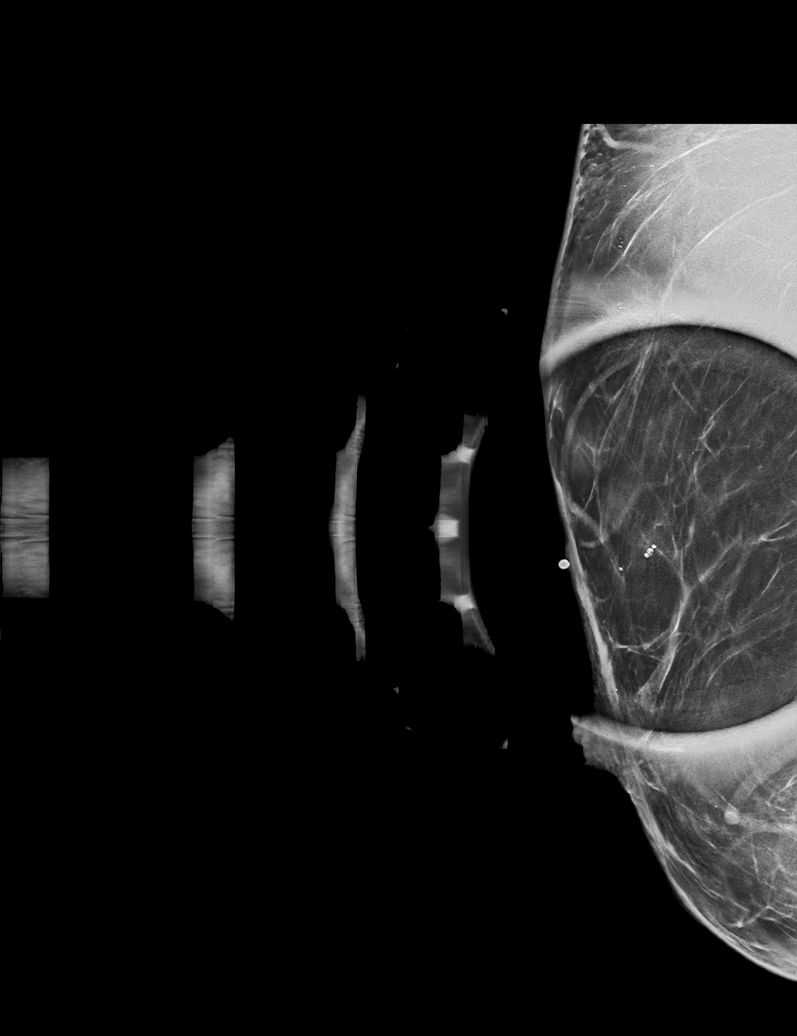

[R MLO synth-2D]
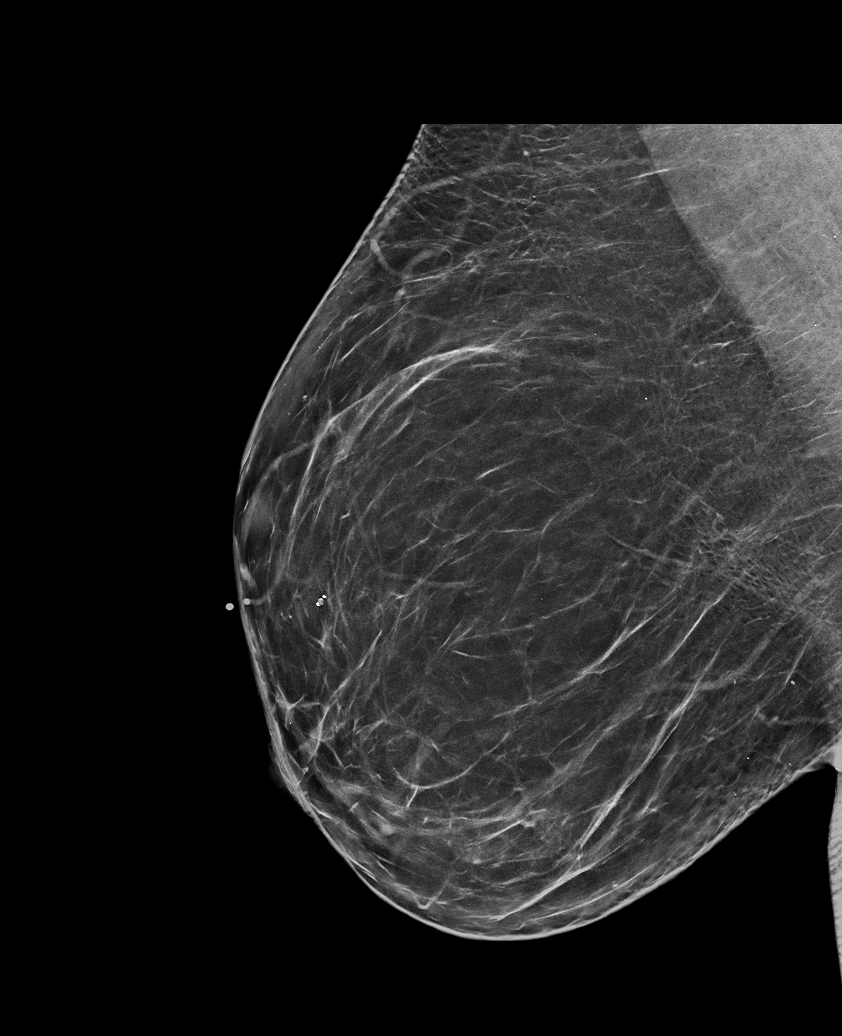

[R CC synth-2D]
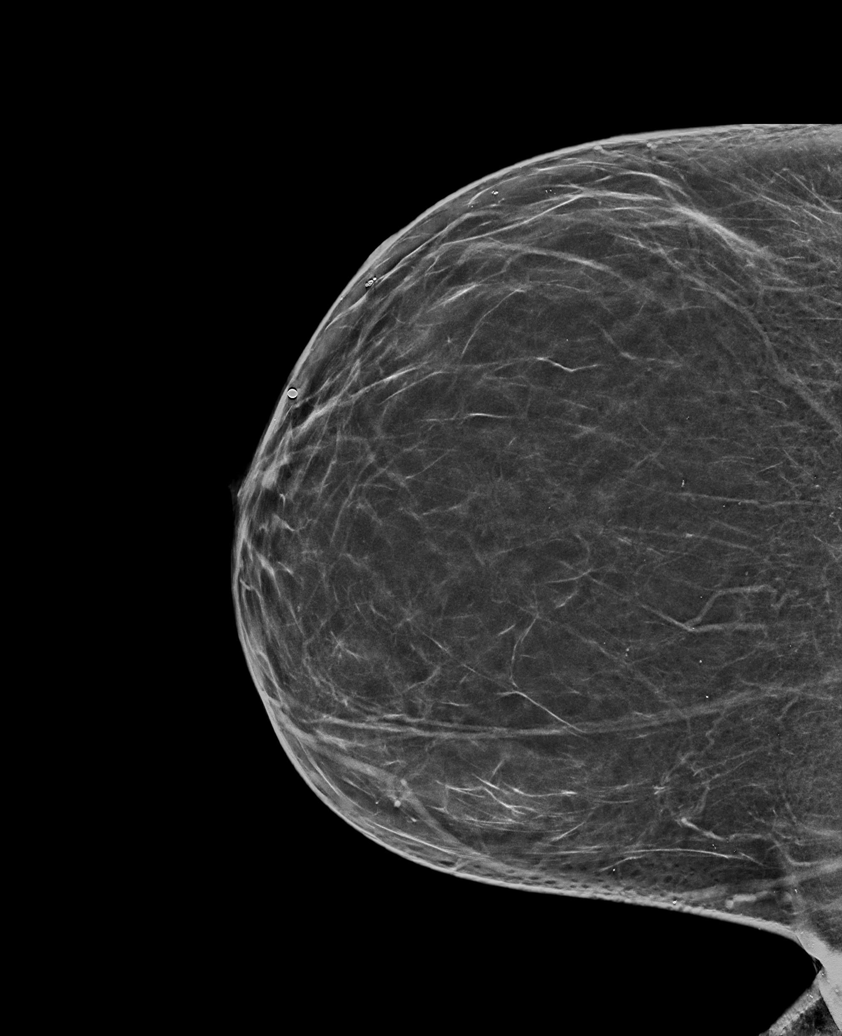

[R TAN tomo · tomo slice 31/62.0]
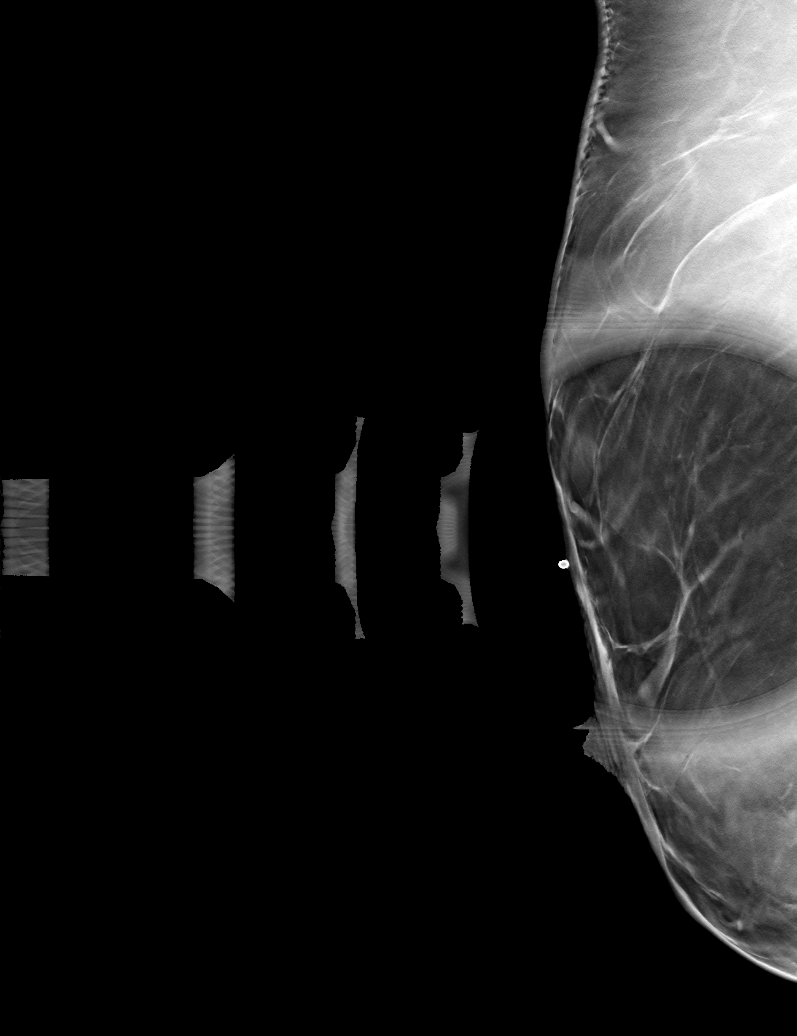

[6 of 30 positions shown; findings below may reference images not displayed]

ACR Breast Density Category b: There are scattered areas of
fibroglandular density.
FINDINGS: No suspicious calcifications, masses or areas of distortion are seen
in the bilateral breasts.

Mammographic images were processed with CAD.

On physical exam, no palpable masses are identified at the site of
skin discoloration. There is speckled pigmented brown discoloration
the skin superior to the right nipple spanning approximately 2 cm.
There is no associated rash, skin thickening or erythema.

Targeted ultrasound is performed, showing normal fibroglandular
tissue at the site of skin discoloration at 12 o'clock, 3 cm from
the nipple. No suspicious masses or areas of shadowing are
identified. No lesions are seen within the skin.
IMPRESSION: 1. There are no mammographic or targeted sonographic abnormalities
at the site of the skin discoloration in the superior right breast.

2.  No mammographic evidence of malignancy in the bilateral breasts.

RECOMMENDATION:
1. Screening mammogram at age 38 unless there are persistent or
intervening clinical concerns. (Code:CN-9-COW)

2. Clinical follow-up recommended for the skin discoloration in the
right breast. Any further workup should be based on clinical
grounds. If there is continued concern, consider skin punch biopsy.

I have discussed the findings and recommendations with the patient.
If applicable, a reminder letter will be sent to the patient
regarding the next appointment.

BI-RADS CATEGORY  1: Negative.

## 2020-09-21 ENCOUNTER — Encounter: Payer: Self-pay | Admitting: Registered"

## 2020-09-21 ENCOUNTER — Other Ambulatory Visit: Payer: Self-pay

## 2020-09-21 ENCOUNTER — Encounter: Payer: No Typology Code available for payment source | Attending: Family Medicine | Admitting: Registered"

## 2020-09-21 DIAGNOSIS — R635 Abnormal weight gain: Secondary | ICD-10-CM | POA: Diagnosis not present

## 2020-09-21 NOTE — Patient Instructions (Addendum)
-   Aim to incorporate swimming as a form of movement.   - Aim to have something for breakfast such as peanut butter toast or avocado toast within 2 hours of waking up.   - Aim to increase fiber intake with nuts, whole grains, fruits, vegetables, beans, lentils, etc.   - Aim to eat about every 3-5 hours.   - Try to balance to meals with lean protein, fruit, vegetables, grains, and dairy. See handout as guide.

## 2020-09-21 NOTE — Progress Notes (Signed)
Medical Nutrition Therapy  Appointment Start time:  8:47  Appointment End time:  9:45  Primary concerns today: weight loss  Referral diagnosis: weight gain Preferred learning style: no preference indicated Learning readiness: ready, change in progress   NUTRITION ASSESSMENT   States she is having trouble losing weight and wants to lose weight. States she does strength training 3x/week, walks every morning, and yoga a few times a month. States she wants a plan of how to eat better, feel better, and lose some weight.   States she has had trouble sustaining thyroid levels. Reports experiencing hair loss and weight gain at times. States she has always been bigger but wants to feel better overall. States she feels like she is carrying the weight.   Reports she has 3 boys (ages 67, 1, and 37 years old). States after workday she is busy with extracurricular  activities for sons and finds her picking up convenient foods. States she and husband meal prep 2-3 Sundays/month.   Clinical Medical Hx: hypothyroidism (diagnosed 2002) Medications: See list Labs: WNL Chol (168), Trg (156), HDL (35), LDL Chol (102) Notable Signs/Symptoms: increased fatigue   Estimated daily fluid intake: 96+ oz Supplements: See list Sleep: 8-9 hrs/night Stress / self-care: walking, reading, hanging out with friends Current average weekly physical activity: strength training 30-45 min, 3x/ week, walking 45 min, 7 days/week, yoga 60 min, 2x/month  24-Hr Dietary Recall First Meal: 2 c of coffee (scoop of collagen protein) Snack (9:30-10 am): acai bowl + granola Second Meal: chicken and spinach sandwich + pretzel bun + tortilla chips  Snack:  Third Meal: 3 chicken nuggets + banana + fries  Snack: croissant Beverages: water (3*32 oz; 96 oz), coffee (2*8 oz; 16 oz), sparkling water, soda (sometimes)   NUTRITION DIAGNOSIS  NB-1.1 Food and nutrition-related knowledge deficit As related to balanced eating.  As  evidenced by dietary recall.   NUTRITION INTERVENTION  Nutrition education (E-1) on the following topics:  Nutrition education and counseling. Pt was educated on the benefits of eating a variety of food groups at each meal. Discussed the purpose of each food group and ways to create balance with already established regimen. Discussed eating every 3-5 hours to help adequately nourish body. Discussed importance of physical activity and ways to increase movement. Pt agreed with goals listed.   Handouts Provided Include  Start Simple with My Plate  Learning Style & Readiness for Change Teaching method utilized: Visual & Auditory  Demonstrated degree of understanding via: Teach Back  Barriers to learning/adherence to lifestyle change: work-life balance  Goals Established by Pt Aim to incorporate swimming as a form of movement.  Aim to have something for breakfast such as peanut butter toast or avocado toast within 2 hours of waking up.  Aim to increase fiber intake with nuts, whole grains, fruits, vegetables, beans, lentils, etc.  Aim to eat about every 3-5 hours.  Try to balance to meals with lean protein, fruit, vegetables, grains, and dairy. See handout as guide.    MONITORING & EVALUATION Dietary intake, weekly physical activity.  Next Steps  Patient is to follow-up prn.

## 2020-10-04 ENCOUNTER — Encounter: Payer: Self-pay | Admitting: Family Medicine

## 2020-10-05 ENCOUNTER — Other Ambulatory Visit: Payer: No Typology Code available for payment source

## 2020-10-06 ENCOUNTER — Other Ambulatory Visit: Payer: Self-pay | Admitting: Family Medicine

## 2020-10-06 ENCOUNTER — Other Ambulatory Visit (HOSPITAL_COMMUNITY): Payer: Self-pay

## 2020-10-06 MED ORDER — SPIRONOLACTONE 50 MG PO TABS
50.0000 mg | ORAL_TABLET | Freq: Two times a day (BID) | ORAL | 1 refills | Status: DC
Start: 2020-07-30 — End: 2021-08-05
  Filled 2020-10-06: qty 60, 30d supply, fill #0

## 2020-10-06 MED ORDER — SPIRONOLACTONE 50 MG PO TABS
50.0000 mg | ORAL_TABLET | Freq: Two times a day (BID) | ORAL | 0 refills | Status: DC
  Filled 2020-10-06: qty 60, 30d supply, fill #0

## 2020-10-06 MED ORDER — LEVOTHYROXINE SODIUM 75 MCG PO TABS
ORAL_TABLET | ORAL | 1 refills | Status: DC
Start: 2020-10-06 — End: 2021-11-18
  Filled 2020-10-06: qty 39, 90d supply, fill #0
  Filled 2020-11-11: qty 36, 84d supply, fill #0
  Filled 2021-02-09: qty 36, 84d supply, fill #1
  Filled 2021-04-10 – 2021-04-19 (×3): qty 36, 84d supply, fill #2
  Filled 2021-06-29: qty 36, 84d supply, fill #3
  Filled 2021-09-29: qty 36, 84d supply, fill #4

## 2020-10-06 MED ORDER — FLUOXETINE HCL 40 MG PO CAPS
40.0000 mg | ORAL_CAPSULE | Freq: Every day | ORAL | 0 refills | Status: DC
  Filled 2020-10-06 – 2020-12-20 (×2): qty 90, 90d supply, fill #0

## 2020-10-06 MED ORDER — FLUOXETINE HCL 20 MG PO CAPS
20.0000 mg | ORAL_CAPSULE | Freq: Every day | ORAL | 3 refills | Status: DC
  Filled 2020-10-06 – 2021-05-11 (×2): qty 90, 90d supply, fill #0

## 2020-10-06 MED ORDER — LEVOTHYROXINE SODIUM 100 MCG PO TABS
ORAL_TABLET | ORAL | 1 refills | Status: DC
Start: 2020-10-06 — End: 2021-10-25
  Filled 2020-10-06: qty 52, 90d supply, fill #0
  Filled 2021-04-11: qty 48, 84d supply, fill #0
  Filled 2021-07-28 – 2021-07-29 (×2): qty 48, 84d supply, fill #1

## 2020-10-14 ENCOUNTER — Other Ambulatory Visit (HOSPITAL_COMMUNITY): Payer: Self-pay

## 2020-11-11 ENCOUNTER — Other Ambulatory Visit (HOSPITAL_COMMUNITY): Payer: Self-pay

## 2020-11-16 ENCOUNTER — Other Ambulatory Visit (HOSPITAL_COMMUNITY): Payer: Self-pay

## 2020-11-19 ENCOUNTER — Other Ambulatory Visit: Payer: No Typology Code available for payment source

## 2020-12-20 ENCOUNTER — Other Ambulatory Visit (HOSPITAL_COMMUNITY): Payer: Self-pay

## 2020-12-27 ENCOUNTER — Other Ambulatory Visit (HOSPITAL_COMMUNITY): Payer: Self-pay

## 2021-01-01 DIAGNOSIS — M25572 Pain in left ankle and joints of left foot: Secondary | ICD-10-CM | POA: Diagnosis not present

## 2021-01-01 DIAGNOSIS — M7989 Other specified soft tissue disorders: Secondary | ICD-10-CM | POA: Diagnosis not present

## 2021-01-01 DIAGNOSIS — W010XXA Fall on same level from slipping, tripping and stumbling without subsequent striking against object, initial encounter: Secondary | ICD-10-CM | POA: Diagnosis not present

## 2021-01-01 DIAGNOSIS — S8262XA Displaced fracture of lateral malleolus of left fibula, initial encounter for closed fracture: Secondary | ICD-10-CM | POA: Diagnosis not present

## 2021-01-04 ENCOUNTER — Ambulatory Visit: Payer: No Typology Code available for payment source

## 2021-01-13 ENCOUNTER — Ambulatory Visit: Payer: No Typology Code available for payment source

## 2021-01-21 ENCOUNTER — Other Ambulatory Visit: Payer: Self-pay

## 2021-01-21 ENCOUNTER — Other Ambulatory Visit (INDEPENDENT_AMBULATORY_CARE_PROVIDER_SITE_OTHER): Payer: No Typology Code available for payment source

## 2021-01-21 DIAGNOSIS — E039 Hypothyroidism, unspecified: Secondary | ICD-10-CM | POA: Diagnosis not present

## 2021-01-21 LAB — TSH: TSH: 3.41 u[IU]/mL (ref 0.35–5.50)

## 2021-01-21 LAB — T4, FREE: Free T4: 0.74 ng/dL (ref 0.60–1.60)

## 2021-01-21 LAB — T3, FREE: T3, Free: 2.8 pg/mL (ref 2.3–4.2)

## 2021-02-09 ENCOUNTER — Other Ambulatory Visit (HOSPITAL_COMMUNITY): Payer: Self-pay

## 2021-02-25 ENCOUNTER — Other Ambulatory Visit (HOSPITAL_COMMUNITY): Payer: Self-pay

## 2021-02-25 MED ORDER — CARESTART COVID-19 HOME TEST VI KIT
PACK | 0 refills | Status: DC
  Filled 2021-02-25 – 2021-04-11 (×2): qty 4, 4d supply, fill #0

## 2021-03-07 ENCOUNTER — Other Ambulatory Visit (HOSPITAL_COMMUNITY): Payer: Self-pay

## 2021-04-06 ENCOUNTER — Encounter: Payer: Self-pay | Admitting: Family Medicine

## 2021-04-11 ENCOUNTER — Other Ambulatory Visit (HOSPITAL_COMMUNITY): Payer: Self-pay

## 2021-04-12 ENCOUNTER — Other Ambulatory Visit (HOSPITAL_COMMUNITY): Payer: Self-pay

## 2021-04-12 MED ORDER — SEMAGLUTIDE-WEIGHT MANAGEMENT 0.25 MG/0.5ML ~~LOC~~ SOAJ
0.2500 mg | SUBCUTANEOUS | 0 refills | Status: AC
  Filled 2021-04-12 – 2021-04-21 (×6): qty 2, 28d supply, fill #0

## 2021-04-12 MED ORDER — SEMAGLUTIDE-WEIGHT MANAGEMENT 1.7 MG/0.75ML ~~LOC~~ SOAJ
1.7000 mg | SUBCUTANEOUS | 0 refills | Status: DC
  Filled 2021-04-12 – 2021-08-10 (×4): qty 3, 28d supply, fill #0

## 2021-04-12 MED ORDER — SEMAGLUTIDE-WEIGHT MANAGEMENT 0.5 MG/0.5ML ~~LOC~~ SOAJ
0.5000 mg | SUBCUTANEOUS | 0 refills | Status: DC
  Filled 2021-04-12 – 2021-05-11 (×3): qty 2, 28d supply, fill #0

## 2021-04-12 MED ORDER — SEMAGLUTIDE-WEIGHT MANAGEMENT 1 MG/0.5ML ~~LOC~~ SOAJ
1.0000 mg | SUBCUTANEOUS | 0 refills | Status: AC
  Filled 2021-04-12 – 2021-06-11 (×2): qty 2, 28d supply, fill #0

## 2021-04-12 MED ORDER — SEMAGLUTIDE-WEIGHT MANAGEMENT 2.4 MG/0.75ML ~~LOC~~ SOAJ
2.4000 mg | SUBCUTANEOUS | 2 refills | Status: DC
  Filled 2021-04-12: qty 3, fill #0
  Filled 2021-05-25: qty 3, 28d supply, fill #0

## 2021-04-14 ENCOUNTER — Telehealth: Payer: Self-pay | Admitting: Family Medicine

## 2021-04-14 ENCOUNTER — Other Ambulatory Visit (HOSPITAL_COMMUNITY): Payer: Self-pay

## 2021-04-14 NOTE — Telephone Encounter (Addendum)
Hannah Douglas from [facility] will refax PA on [email or web address]. The PA's info is in md folder all the strengths needs PA

## 2021-04-15 ENCOUNTER — Other Ambulatory Visit (HOSPITAL_COMMUNITY): Payer: Self-pay

## 2021-04-15 NOTE — Telephone Encounter (Signed)
Prior auth for [facility] 0.25MG /0.5ML auto-injectors sent to [email or web address]-Key: [id number].

## 2021-04-18 ENCOUNTER — Other Ambulatory Visit (HOSPITAL_COMMUNITY): Payer: Self-pay

## 2021-04-19 ENCOUNTER — Other Ambulatory Visit (HOSPITAL_COMMUNITY): Payer: Self-pay

## 2021-04-20 ENCOUNTER — Other Ambulatory Visit (HOSPITAL_COMMUNITY): Payer: Self-pay

## 2021-04-21 ENCOUNTER — Other Ambulatory Visit (HOSPITAL_COMMUNITY): Payer: Self-pay

## 2021-04-22 ENCOUNTER — Other Ambulatory Visit (HOSPITAL_COMMUNITY): Payer: Self-pay

## 2021-05-06 IMAGING — DX DG CHEST 2V
2 series · 2 of 2 positions shown · non-contrast
Comparison: January 18, 2017

CLINICAL DATA: Cough x1 month.

EXAM:
CHEST - 2 VIEW

[chest pa]
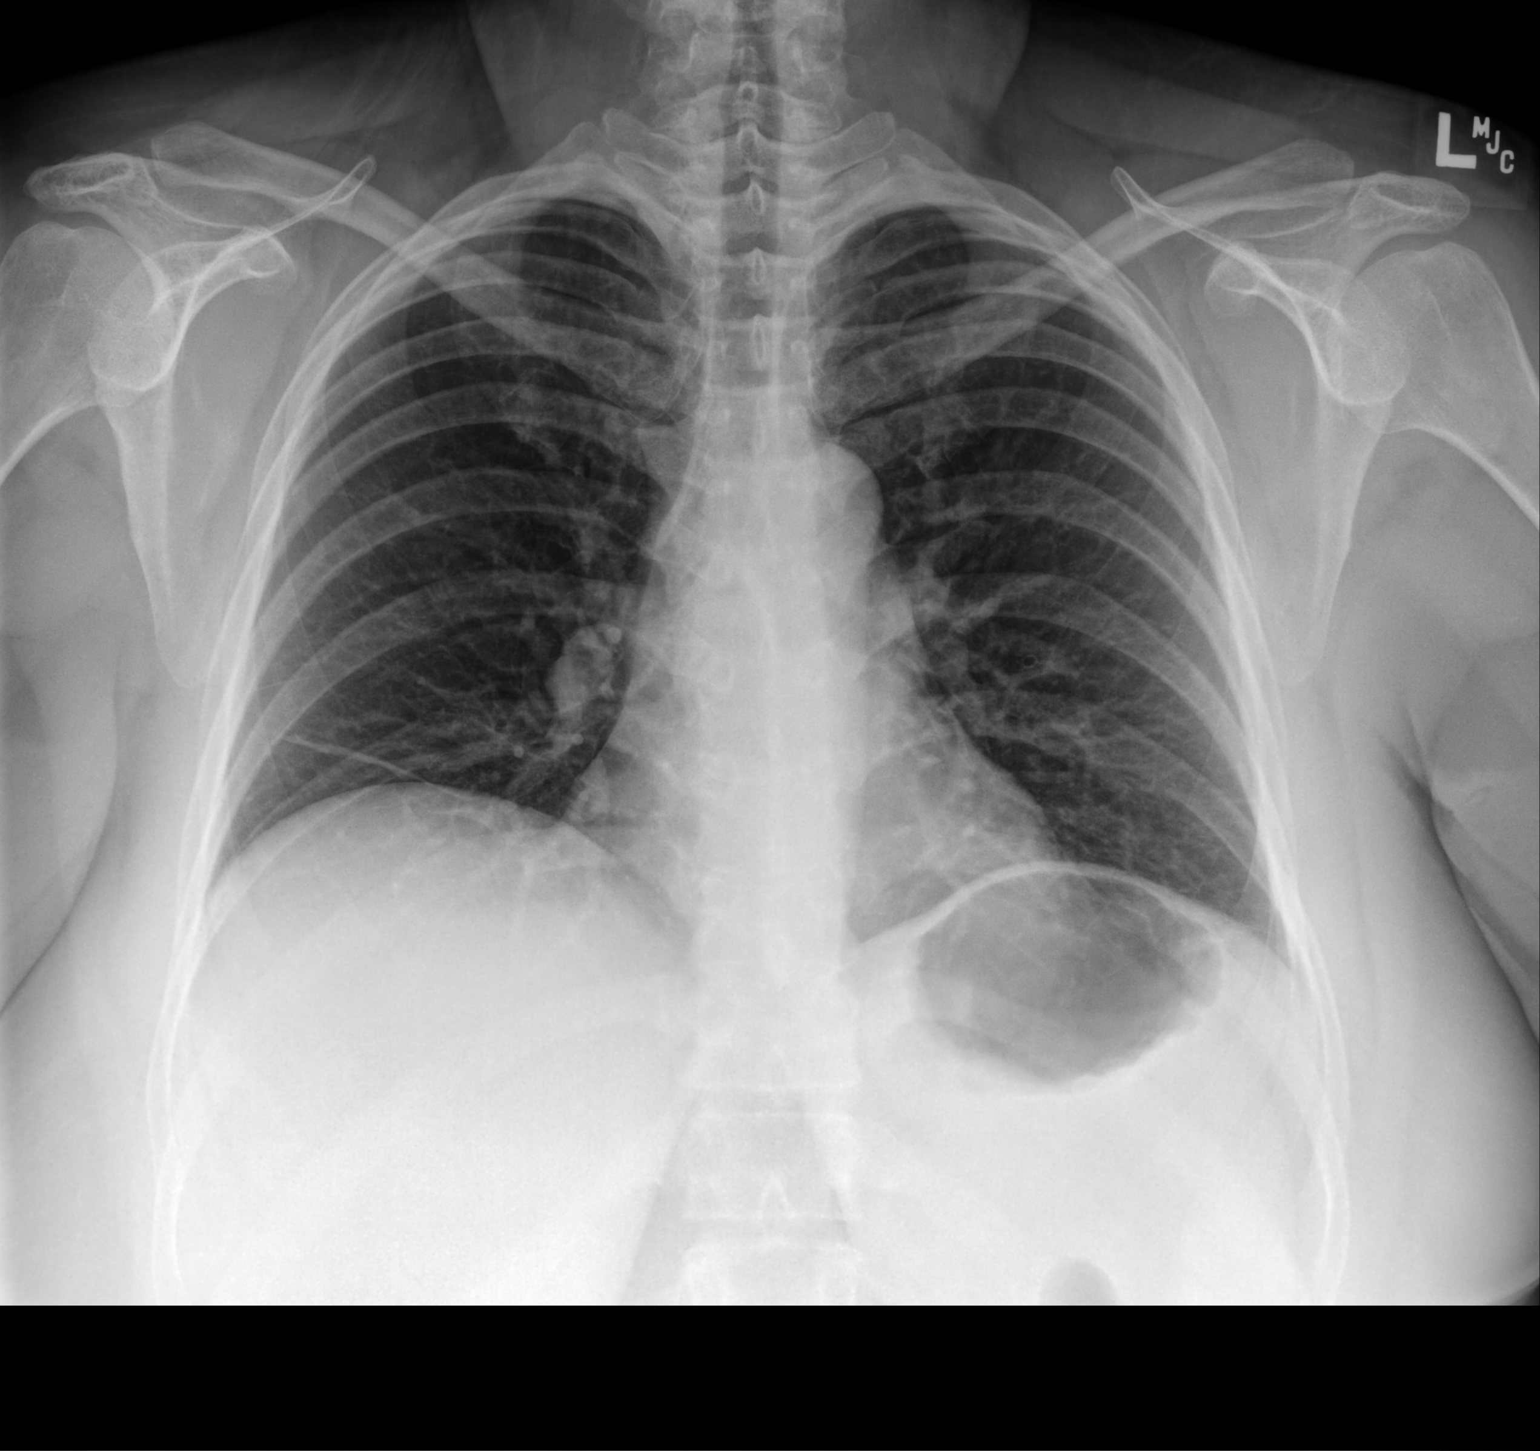

[chest lat]
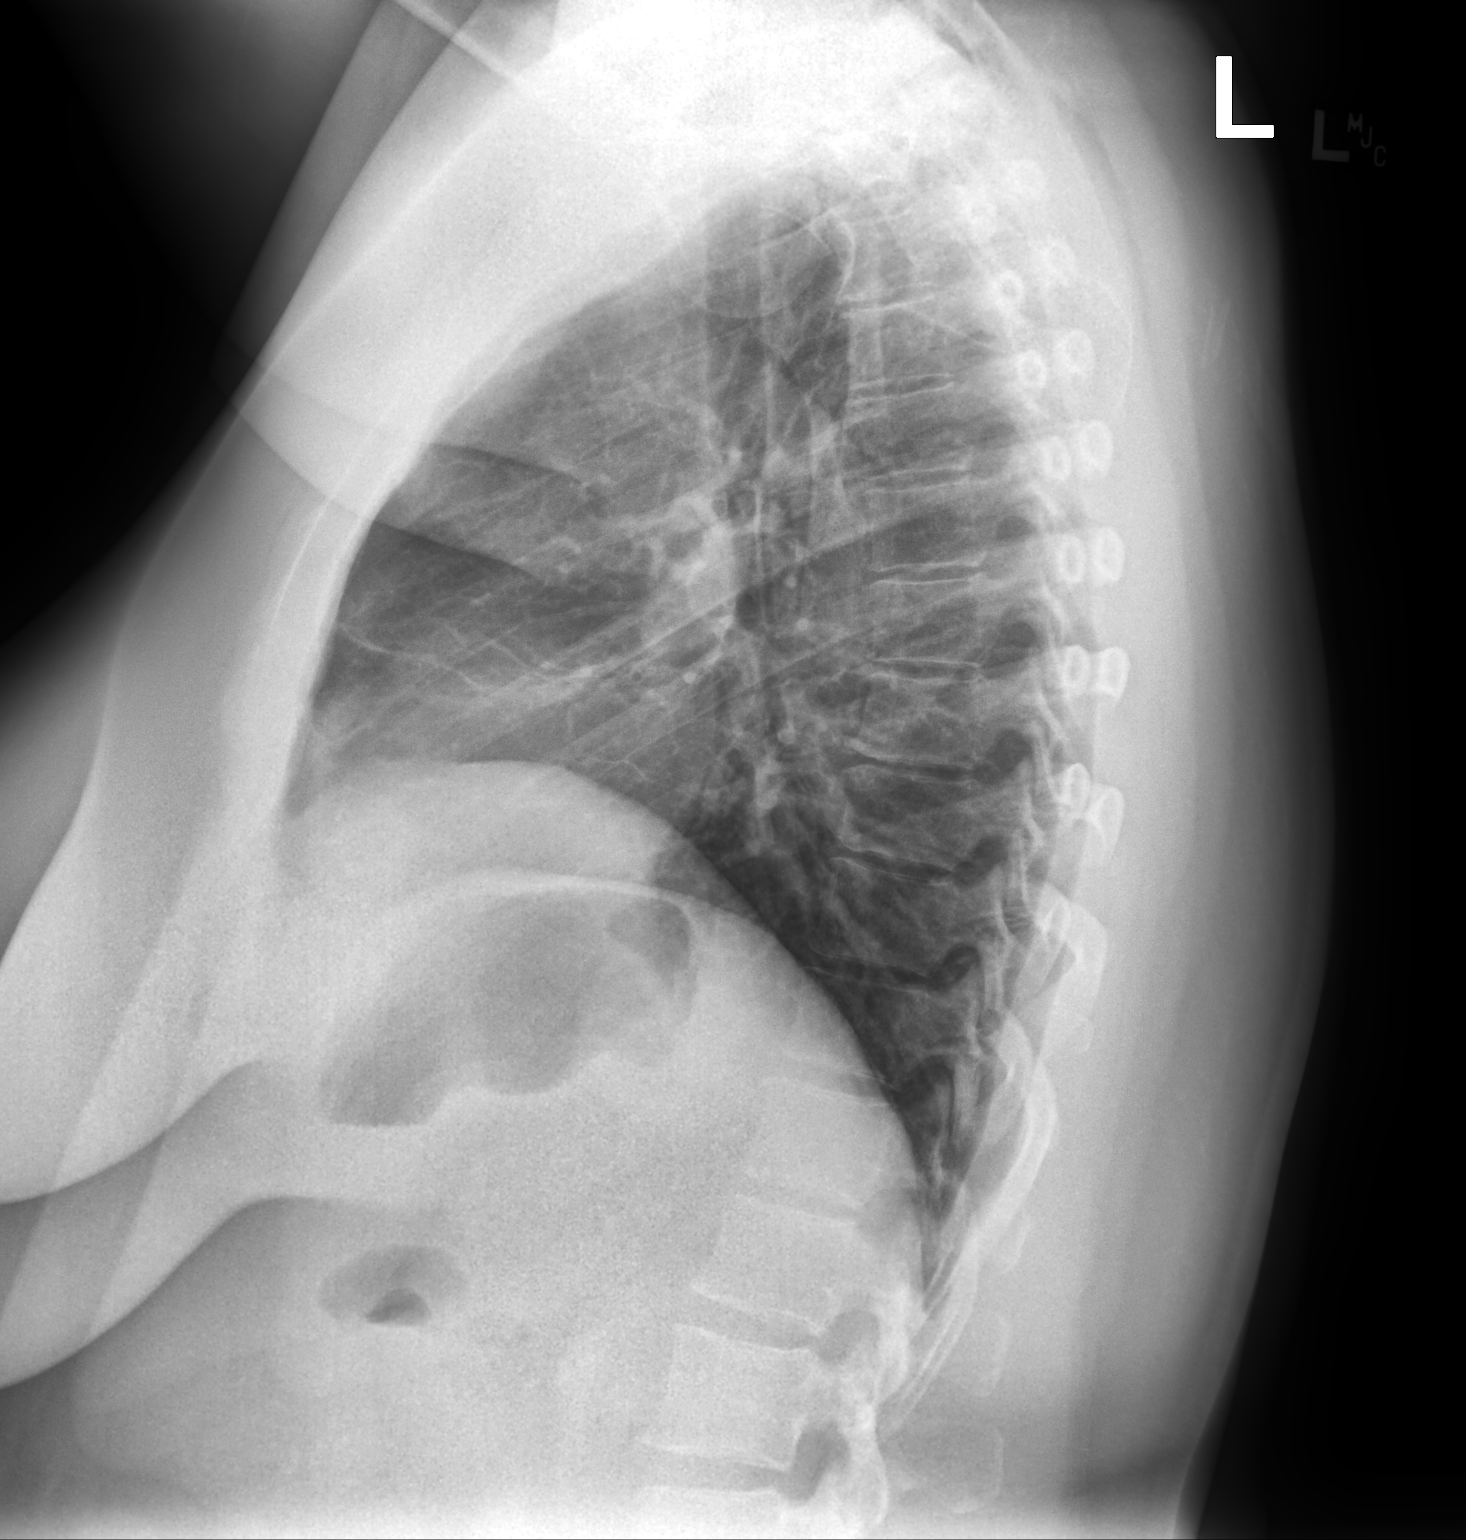

[2 of 2 positions shown; findings below may reference images not displayed]

FINDINGS: Mild, stable linear scarring is seen within the right lung base.
There is no evidence of acute infiltrate, pleural effusion or
pneumothorax. The heart size and mediastinal contours are within
normal limits. The visualized skeletal structures are unremarkable.
IMPRESSION: No active cardiopulmonary disease.

## 2021-05-11 ENCOUNTER — Other Ambulatory Visit (HOSPITAL_COMMUNITY): Payer: Self-pay

## 2021-05-25 ENCOUNTER — Other Ambulatory Visit (HOSPITAL_COMMUNITY): Payer: Self-pay

## 2021-05-29 ENCOUNTER — Other Ambulatory Visit: Payer: Self-pay

## 2021-05-30 ENCOUNTER — Other Ambulatory Visit (HOSPITAL_COMMUNITY): Payer: Self-pay

## 2021-05-31 ENCOUNTER — Other Ambulatory Visit (HOSPITAL_COMMUNITY): Payer: Self-pay

## 2021-05-31 ENCOUNTER — Other Ambulatory Visit: Payer: Self-pay

## 2021-06-02 ENCOUNTER — Other Ambulatory Visit (HOSPITAL_COMMUNITY): Payer: Self-pay

## 2021-06-02 ENCOUNTER — Other Ambulatory Visit: Payer: Self-pay

## 2021-06-03 ENCOUNTER — Other Ambulatory Visit (HOSPITAL_COMMUNITY): Payer: Self-pay

## 2021-06-07 ENCOUNTER — Other Ambulatory Visit (HOSPITAL_COMMUNITY): Payer: Self-pay

## 2021-06-07 ENCOUNTER — Other Ambulatory Visit: Payer: Self-pay

## 2021-06-08 ENCOUNTER — Other Ambulatory Visit (HOSPITAL_COMMUNITY): Payer: Self-pay

## 2021-06-13 ENCOUNTER — Other Ambulatory Visit (HOSPITAL_COMMUNITY): Payer: Self-pay

## 2021-06-29 ENCOUNTER — Other Ambulatory Visit (HOSPITAL_COMMUNITY): Payer: Self-pay

## 2021-07-18 ENCOUNTER — Other Ambulatory Visit (HOSPITAL_COMMUNITY): Payer: Self-pay

## 2021-07-18 ENCOUNTER — Telehealth: Payer: Self-pay | Admitting: *Deleted

## 2021-07-18 NOTE — Telephone Encounter (Signed)
Prior auth for [facility] 1.7mg  sent to [email or web address]-Key: [id number] pending review by the patient's insurance. ?

## 2021-07-20 ENCOUNTER — Other Ambulatory Visit (HOSPITAL_COMMUNITY): Payer: Self-pay

## 2021-07-21 ENCOUNTER — Other Ambulatory Visit (HOSPITAL_COMMUNITY): Payer: Self-pay

## 2021-07-21 MED ORDER — FLUOXETINE HCL 20 MG PO CAPS
20.0000 mg | ORAL_CAPSULE | Freq: Every day | ORAL | 4 refills | Status: DC
  Filled 2021-07-21 – 2021-07-29 (×2): qty 90, 90d supply, fill #0
  Filled 2021-11-06 – 2021-11-18 (×2): qty 90, 90d supply, fill #1

## 2021-07-21 MED ORDER — BUPROPION HCL ER (XL) 150 MG PO TB24
150.0000 mg | ORAL_TABLET | Freq: Every morning | ORAL | 4 refills | Status: DC
  Filled 2021-07-21 – 2021-07-29 (×2): qty 90, 90d supply, fill #0

## 2021-07-21 MED ORDER — ALPRAZOLAM 0.5 MG PO TABS
0.5000 mg | ORAL_TABLET | Freq: Every day | ORAL | 1 refills | Status: AC | PRN
Start: 2021-07-21 — End: ?
  Filled 2021-07-21 – 2021-07-29 (×2): qty 90, 90d supply, fill #0
  Filled 2021-12-23: qty 90, 90d supply, fill #1

## 2021-07-21 MED ORDER — FLUOXETINE HCL 40 MG PO CAPS
40.0000 mg | ORAL_CAPSULE | Freq: Every day | ORAL | 4 refills | Status: DC
  Filled 2021-07-21 – 2021-07-29 (×2): qty 90, 90d supply, fill #0
  Filled 2021-10-03: qty 90, 90d supply, fill #1
  Filled 2022-04-05: qty 90, 90d supply, fill #2
  Filled 2022-06-16 – 2022-06-30 (×2): qty 90, 90d supply, fill #3

## 2021-07-21 NOTE — Telephone Encounter (Signed)
Fax received from [employer] stating the request was denied and given to PCP for review. 

## 2021-07-25 ENCOUNTER — Other Ambulatory Visit (HOSPITAL_COMMUNITY): Payer: Self-pay

## 2021-07-26 NOTE — Telephone Encounter (Signed)
Fax sent to be scanned. 

## 2021-07-28 ENCOUNTER — Other Ambulatory Visit (HOSPITAL_COMMUNITY): Payer: Self-pay

## 2021-07-29 ENCOUNTER — Other Ambulatory Visit (HOSPITAL_COMMUNITY): Payer: Self-pay

## 2021-08-01 ENCOUNTER — Other Ambulatory Visit (HOSPITAL_COMMUNITY): Payer: Self-pay

## 2021-08-02 ENCOUNTER — Telehealth: Payer: Self-pay | Admitting: Family Medicine

## 2021-08-02 NOTE — Telephone Encounter (Signed)
Patient would like to come early to do lab work so results are back before physical on Friday, will need orders placed so she can be put on lab schedule. ? ? ? ? ?Please advise  ?

## 2021-08-03 ENCOUNTER — Other Ambulatory Visit: Payer: Self-pay | Admitting: Family Medicine

## 2021-08-03 DIAGNOSIS — Z13220 Encounter for screening for lipoid disorders: Secondary | ICD-10-CM

## 2021-08-03 DIAGNOSIS — E039 Hypothyroidism, unspecified: Secondary | ICD-10-CM

## 2021-08-03 DIAGNOSIS — Z131 Encounter for screening for diabetes mellitus: Secondary | ICD-10-CM

## 2021-08-03 NOTE — Telephone Encounter (Signed)
Spoke with the patient and scheduled a lab appt on 5/11. ?

## 2021-08-03 NOTE — Telephone Encounter (Signed)
Lab orders placed.  

## 2021-08-04 ENCOUNTER — Other Ambulatory Visit (INDEPENDENT_AMBULATORY_CARE_PROVIDER_SITE_OTHER): Payer: No Typology Code available for payment source

## 2021-08-04 DIAGNOSIS — Z131 Encounter for screening for diabetes mellitus: Secondary | ICD-10-CM | POA: Diagnosis not present

## 2021-08-04 DIAGNOSIS — Z13220 Encounter for screening for lipoid disorders: Secondary | ICD-10-CM

## 2021-08-04 DIAGNOSIS — E039 Hypothyroidism, unspecified: Secondary | ICD-10-CM

## 2021-08-04 LAB — CBC WITH DIFFERENTIAL/PLATELET
Basophils Absolute: 0 10*3/uL (ref 0.0–0.1)
Basophils Relative: 0.5 % (ref 0.0–3.0)
Eosinophils Absolute: 0.1 10*3/uL (ref 0.0–0.7)
Eosinophils Relative: 1.6 % (ref 0.0–5.0)
HCT: 38.9 % (ref 36.0–46.0)
Hemoglobin: 13.6 g/dL (ref 12.0–15.0)
Lymphocytes Relative: 41.3 % (ref 12.0–46.0)
Lymphs Abs: 3.3 10*3/uL (ref 0.7–4.0)
MCHC: 35.1 g/dL (ref 30.0–36.0)
MCV: 89.3 fl (ref 78.0–100.0)
Monocytes Absolute: 0.4 10*3/uL (ref 0.1–1.0)
Monocytes Relative: 4.8 % (ref 3.0–12.0)
Neutro Abs: 4.1 10*3/uL (ref 1.4–7.7)
Neutrophils Relative %: 51.8 % (ref 43.0–77.0)
Platelets: 305 10*3/uL (ref 150.0–400.0)
RBC: 4.35 Mil/uL (ref 3.87–5.11)
RDW: 13.2 % (ref 11.5–15.5)
WBC: 8 10*3/uL (ref 4.0–10.5)

## 2021-08-04 LAB — COMPREHENSIVE METABOLIC PANEL
ALT: 35 U/L (ref 0–35)
AST: 21 U/L (ref 0–37)
Albumin: 4.4 g/dL (ref 3.5–5.2)
Alkaline Phosphatase: 58 U/L (ref 39–117)
BUN: 9 mg/dL (ref 6–23)
CO2: 24 mEq/L (ref 19–32)
Calcium: 9.2 mg/dL (ref 8.4–10.5)
Chloride: 104 mEq/L (ref 96–112)
Creatinine, Ser: 0.64 mg/dL (ref 0.40–1.20)
GFR: 112.84 mL/min (ref >60.00)
Glucose, Bld: 87 mg/dL (ref 70–99)
Potassium: 4.1 mEq/L (ref 3.5–5.1)
Sodium: 136 mEq/L (ref 135–145)
Total Bilirubin: 0.4 mg/dL (ref 0.2–1.2)
Total Protein: 7.3 g/dL (ref 6.0–8.3)

## 2021-08-04 LAB — TSH: TSH: 3.3 u[IU]/mL (ref 0.35–5.50)

## 2021-08-04 LAB — LIPID PANEL
Cholesterol: 170 mg/dL (ref 0–200)
HDL: 36.9 mg/dL — ABNORMAL LOW (ref >39.00)
LDL Cholesterol: 106 mg/dL — ABNORMAL HIGH (ref 0–99)
NonHDL: 133.38
Total CHOL/HDL Ratio: 5
Triglycerides: 135 mg/dL (ref 0.0–149.0)
VLDL: 27 mg/dL (ref 0.0–40.0)

## 2021-08-05 ENCOUNTER — Other Ambulatory Visit (HOSPITAL_COMMUNITY)
Admission: RE | Admit: 2021-08-05 | Discharge: 2021-08-05 | Disposition: A | Discharge status: Home / Self Care | Payer: No Typology Code available for payment source | Source: Ambulatory Visit | Attending: Family Medicine | Admitting: Family Medicine

## 2021-08-05 ENCOUNTER — Ambulatory Visit (INDEPENDENT_AMBULATORY_CARE_PROVIDER_SITE_OTHER): Payer: No Typology Code available for payment source | Admitting: Family Medicine

## 2021-08-05 ENCOUNTER — Encounter: Payer: Self-pay | Admitting: Family Medicine

## 2021-08-05 ENCOUNTER — Other Ambulatory Visit: Payer: Self-pay

## 2021-08-05 VITALS — BP 118/60 | HR 91 | Temp 98.7°F | Ht 64.75 in | Wt 241.4 lb

## 2021-08-05 DIAGNOSIS — Z6841 Body Mass Index (BMI) 40.0 and over, adult: Secondary | ICD-10-CM

## 2021-08-05 DIAGNOSIS — Z0000 Encounter for general adult medical examination without abnormal findings: Secondary | ICD-10-CM | POA: Diagnosis not present

## 2021-08-05 DIAGNOSIS — D649 Anemia, unspecified: Secondary | ICD-10-CM

## 2021-08-05 DIAGNOSIS — L732 Hidradenitis suppurativa: Secondary | ICD-10-CM

## 2021-08-05 DIAGNOSIS — E039 Hypothyroidism, unspecified: Secondary | ICD-10-CM | POA: Diagnosis not present

## 2021-08-05 NOTE — Progress Notes (Signed)
Hannah Douglas ?DOB: May 07, 1985 ?Encounter date: 08/05/2021 ? ?This is a 36 y.o. female who presents for complete physical  ? ?History of present illness/Additional concerns: ?Last visit was 1 year ago for a physical. She does yoga weekly, walking twice daily. Was on wegovy and had been working really well. Helped with food noise. Felt like she was full and not needing food. Strength training twice weekly. She tried keto in past, low carb diet, weight watchers. Had initial success with keto but not very sustainable for her. But with adding back in small carbs, she gained weight back. No success with weight watchers. We started on wegovy in January. Had lost 16lb with this by April (which was last weight and when insurance stopped covering this). Has gained since stopping medication. From initial weight, patient had lost more than 5% of body weight with the wegovy.she was on the 1mg  when she was required to stop due to lack of coverage from insurance, but was still planning to continue titration up at that point.  ? ?She has not been seeing dermatology as much for the hidradenitis due to cost.  She was also not certain that injections were helping.  The maintenance of weekly shots was too costly. ? ?Past Medical History:  ?Diagnosis Date  ? Anxiety   ? Depression   ? Hypothyroidism   ? ?Past Surgical History:  ?Procedure Laterality Date  ? NO PAST SURGERIES    ? ?No Known Allergies ?Current Meds  ?Medication Sig  ? ALPRAZolam (XANAX) 0.5 MG tablet Take 1 tablet (0.5 mg total) by mouth daily as needed.  ? DESVENLAFAXINE ER PO Take 25 mg by mouth daily.  ? FLUoxetine (PROZAC) 20 MG capsule Take 1 capsule (20 mg total) by mouth daily with 40 mg capsule to equal 60 mg total.  ? FLUoxetine (PROZAC) 40 MG capsule Take 1 capsule (40 mg total) by mouth daily. Take with 20 mg capsule to equal 60 mg total daily.  ? levothyroxine (SYNTHROID) 100 MCG tablet Take 1 tablet by mouth  4 days per week. Alternate with levothyroxine  38mcg.  ? levothyroxine (SYNTHROID) 75 MCG tablet Take 1 tablet by mouth three days per week. Alternative with levothyroxine 173mcg.  ? Multiple Vitamin (MULTIVITAMIN) tablet Take 1 tablet by mouth daily.  ? Omega-3 Fatty Acids (FISH OIL) 1000 MG CAPS Take by mouth.  ? spironolactone (ALDACTONE) 50 MG tablet Take 50 mg by mouth 2 (two) times daily.  ? ?Social History  ? ?Tobacco Use  ? Smoking status: Never  ? Smokeless tobacco: Never  ?Substance Use Topics  ? Alcohol use: Yes  ?  Comment: occ  ? ?Family History  ?Problem Relation Age of Onset  ? Lung disease Father 29  ?     idiopathic pulmonary fibrosis  ? Cancer Maternal Grandmother   ?     unknown type  ? Lung cancer Maternal Grandfather   ? Breast cancer Paternal Grandmother   ? Lung cancer Paternal Grandfather   ? Healthy Mother   ? Healthy Brother   ? Healthy Brother   ? Alcohol abuse Neg Hx   ? Arthritis Neg Hx   ? Asthma Neg Hx   ? Birth defects Neg Hx   ? COPD Neg Hx   ? Depression Neg Hx   ? Diabetes Neg Hx   ? Drug abuse Neg Hx   ? Early death Neg Hx   ? Hearing loss Neg Hx   ? Heart disease Neg Hx   ?  Hyperlipidemia Neg Hx   ? Hypertension Neg Hx   ? Kidney disease Neg Hx   ? Learning disabilities Neg Hx   ? Mental illness Neg Hx   ? Mental retardation Neg Hx   ? Miscarriages / Stillbirths Neg Hx   ? Stroke Neg Hx   ? Vision loss Neg Hx   ? Varicose Veins Neg Hx   ? ? ? ?Review of Systems  ?Constitutional:  Negative for activity change, appetite change, chills, fatigue, fever and unexpected weight change.  ?HENT:  Negative for congestion, ear pain, hearing loss, sinus pressure, sinus pain, sore throat and trouble swallowing.   ?Eyes:  Negative for pain and visual disturbance.  ?Respiratory:  Negative for cough, chest tightness, shortness of breath and wheezing.   ?Cardiovascular:  Negative for chest pain, palpitations and leg swelling.  ?Gastrointestinal:  Negative for abdominal pain, blood in stool, constipation, diarrhea, nausea and vomiting.   ?Genitourinary:  Negative for difficulty urinating and menstrual problem.  ?Musculoskeletal:  Negative for arthralgias and back pain.  ?Skin:  Negative for rash.  ?Neurological:  Negative for dizziness, weakness, numbness and headaches.  ?Hematological:  Negative for adenopathy. Does not bruise/bleed easily.  ?Psychiatric/Behavioral:  Negative for sleep disturbance and suicidal ideas. The patient is not nervous/anxious.   ? ?CBC:  ?Lab Results  ?Component Value Date  ? WBC 8.0 08/04/2021  ? HGB 13.6 08/04/2021  ? HCT 38.9 08/04/2021  ? MCH 29.2 08/18/2016  ? MCHC 35.1 08/04/2021  ? RDW 13.2 08/04/2021  ? PLT 305.0 08/04/2021  ? ?CMP: ?Lab Results  ?Component Value Date  ? NA 136 08/04/2021  ? K 4.1 08/04/2021  ? CL 104 08/04/2021  ? CO2 24 08/04/2021  ? GLUCOSE 87 08/04/2021  ? BUN 9 08/04/2021  ? CREATININE 0.64 08/04/2021  ? CALCIUM 9.2 08/04/2021  ? PROT 7.3 08/04/2021  ? BILITOT 0.4 08/04/2021  ? ALKPHOS 58 08/04/2021  ? ALT 35 08/04/2021  ? AST 21 08/04/2021  ? ?LIPID: ?Lab Results  ?Component Value Date  ? CHOL 170 08/04/2021  ? TRIG 135.0 08/04/2021  ? HDL 36.90 (L) 08/04/2021  ? LDLCALC 106 (H) 08/04/2021  ? ? ?Objective: ? ?BP 118/60   Pulse 91   Temp 98.7 ?F (37.1 ?C) (Oral)   Ht 5' 4.75" (1.645 m)   Wt 241 lb 6 oz (109.5 kg)   SpO2 99%   BMI 40.48 kg/m?   Weight: 241 lb 6 oz (109.5 kg)  ? ?BP Readings from Last 3 Encounters:  ?08/05/21 118/60  ?08/04/20 104/72  ?02/04/20 100/78  ? ?Wt Readings from Last 3 Encounters:  ?08/05/21 241 lb 6 oz (109.5 kg)  ?08/04/20 245 lb 3.2 oz (111.2 kg)  ?02/04/20 231 lb 11.2 oz (105.1 kg)  ? ? ?Physical Exam ?Constitutional:   ?   General: She is not in acute distress. ?   Appearance: She is well-developed.  ?HENT:  ?   Head: Normocephalic and atraumatic.  ?   Right Ear: External ear normal.  ?   Left Ear: External ear normal.  ?   Mouth/Throat:  ?   Pharynx: No oropharyngeal exudate.  ?Eyes:  ?   Conjunctiva/sclera: Conjunctivae normal.  ?   Pupils: Pupils are  equal, round, and reactive to light.  ?Neck:  ?   Thyroid: No thyromegaly.  ?Cardiovascular:  ?   Rate and Rhythm: Normal rate and regular rhythm.  ?   Heart sounds: Normal heart sounds. No murmur heard. ?  No friction  rub. No gallop.  ?Pulmonary:  ?   Effort: Pulmonary effort is normal.  ?   Breath sounds: Normal breath sounds.  ?Abdominal:  ?   General: Bowel sounds are normal. There is no distension.  ?   Palpations: Abdomen is soft. There is no mass.  ?   Tenderness: There is no abdominal tenderness. There is no guarding.  ?   Hernia: No hernia is present.  ?Genitourinary: ?   Vagina: Vaginal discharge (white) present.  ?   Cervix: Normal.  ?   Uterus: Normal.   ?   Adnexa: Right adnexa normal and left adnexa normal.  ?Musculoskeletal:     ?   General: No tenderness or deformity. Normal range of motion.  ?   Cervical back: Normal range of motion and neck supple.  ?Lymphadenopathy:  ?   Cervical: No cervical adenopathy.  ?Skin: ?   General: Skin is warm and dry.  ?   Findings: No rash.  ?   Comments: She has scarring of her breasts and chest secondary to hidradenitis.  ?Neurological:  ?   Mental Status: She is alert and oriented to person, place, and time.  ?   Deep Tendon Reflexes: Reflexes normal.  ?   Reflex Scores: ?     Tricep reflexes are 2+ on the right side and 2+ on the left side. ?     Bicep reflexes are 2+ on the right side and 2+ on the left side. ?     Brachioradialis reflexes are 2+ on the right side and 2+ on the left side. ?     Patellar reflexes are 2+ on the right side and 2+ on the left side. ?Psychiatric:     ?   Speech: Speech normal.     ?   Behavior: Behavior normal.     ?   Thought Content: Thought content normal.  ? ? ?Assessment/Plan: ?Health Maintenance Due  ?Topic Date Due  ? PAP SMEAR-Modifier  10/25/2020  ? ?Health Maintenance reviewed. ? ?1. Preventative health care ?Keep up with regular exercise and working on healthy eating. ?- PAP [[place]] ?- Cervicovaginal ancillary  only ? ?2. Hypothyroidism, unspecified type ?Has been well controlled on alternating 100 mcg and 75 mcg of Synthroid.  Continue with this. ? ?3. Hidradenitis suppurativa ?She has been taking spironolactone 50 mg twice daily.

## 2021-08-07 MED ORDER — SPIRONOLACTONE 50 MG PO TABS
50.0000 mg | ORAL_TABLET | Freq: Two times a day (BID) | ORAL | 1 refills | Status: DC
  Filled 2021-08-07: qty 180, 90d supply, fill #0

## 2021-08-08 ENCOUNTER — Encounter: Payer: Self-pay | Admitting: Family Medicine

## 2021-08-08 ENCOUNTER — Other Ambulatory Visit (HOSPITAL_COMMUNITY): Payer: Self-pay

## 2021-08-08 LAB — CERVICOVAGINAL ANCILLARY ONLY
Bacterial Vaginitis (gardnerella): NEGATIVE
Candida Glabrata: NEGATIVE
Candida Vaginitis: NEGATIVE
Chlamydia: NEGATIVE
Comment: NEGATIVE
Comment: NEGATIVE
Comment: NEGATIVE
Comment: NEGATIVE
Comment: NEGATIVE
Comment: NORMAL
Neisseria Gonorrhea: NEGATIVE
Trichomonas: NEGATIVE

## 2021-08-10 ENCOUNTER — Other Ambulatory Visit (HOSPITAL_COMMUNITY): Payer: Self-pay

## 2021-08-10 LAB — CYTOLOGY - PAP
Adequacy: ABSENT
Diagnosis: NEGATIVE

## 2021-08-11 ENCOUNTER — Telehealth: Payer: Self-pay | Admitting: *Deleted

## 2021-08-11 ENCOUNTER — Other Ambulatory Visit (HOSPITAL_COMMUNITY): Payer: Self-pay

## 2021-08-11 NOTE — Telephone Encounter (Signed)
Error

## 2021-08-23 ENCOUNTER — Other Ambulatory Visit (HOSPITAL_COMMUNITY): Payer: Self-pay

## 2021-09-04 ENCOUNTER — Other Ambulatory Visit: Payer: Self-pay | Admitting: Family Medicine

## 2021-09-05 ENCOUNTER — Other Ambulatory Visit (HOSPITAL_COMMUNITY): Payer: Self-pay

## 2021-09-05 MED ORDER — WEGOVY 1.7 MG/0.75ML ~~LOC~~ SOAJ
1.7000 mg | SUBCUTANEOUS | 0 refills | Status: AC
  Filled 2021-09-05: qty 3, 28d supply, fill #0

## 2021-09-13 ENCOUNTER — Other Ambulatory Visit (HOSPITAL_COMMUNITY): Payer: Self-pay

## 2021-09-14 ENCOUNTER — Other Ambulatory Visit (HOSPITAL_COMMUNITY): Payer: Self-pay

## 2021-09-19 ENCOUNTER — Encounter (INDEPENDENT_AMBULATORY_CARE_PROVIDER_SITE_OTHER): Payer: Self-pay

## 2021-09-30 ENCOUNTER — Other Ambulatory Visit (HOSPITAL_COMMUNITY): Payer: Self-pay

## 2021-10-03 ENCOUNTER — Other Ambulatory Visit (HOSPITAL_COMMUNITY): Payer: Self-pay

## 2021-10-04 ENCOUNTER — Other Ambulatory Visit (HOSPITAL_COMMUNITY): Payer: Self-pay

## 2021-10-05 ENCOUNTER — Other Ambulatory Visit (HOSPITAL_COMMUNITY): Payer: Self-pay

## 2021-10-10 ENCOUNTER — Other Ambulatory Visit (HOSPITAL_COMMUNITY): Payer: Self-pay

## 2021-10-13 ENCOUNTER — Telehealth: Payer: Self-pay | Admitting: Family Medicine

## 2021-10-13 NOTE — Telephone Encounter (Signed)
Spoke with the patient and informed her of the message below.  Appt scheduled with Dr Casimiro Needle on 7/24.

## 2021-10-13 NOTE — Telephone Encounter (Signed)
I also offered patient appointments tomorrow with [facility], but she cannot take off work.      FYI

## 2021-10-13 NOTE — Telephone Encounter (Signed)
Patient called because she is ready to move up to the 2.4mg  of wegovy. She has been on her current injection for about two months and states that she is tolerating it well. Patient assumed that Dr. Hassan Rowan had wrote her prescriptions and refills for the rest of the wegovy injections but she did not see anything in mychart. I asked patient if she had called the pharmacy to see if they had anything waiting and she had not. I advised patient to call them and check, then call us and give an update, which we can attach to previous message.     Please advise

## 2021-10-14 DIAGNOSIS — F32A Depression, unspecified: Secondary | ICD-10-CM | POA: Insufficient documentation

## 2021-10-14 NOTE — Progress Notes (Addendum)
Virtual Visit via Video Note  I connected with Hannah Douglas on 10/17/21 at 11:30 AM EDT by a video enabled telemedicine application and verified that I am speaking with the correct person using two identifiers.  Location: Patient: [place] Provider: [facility] office   I discussed the limitations of evaluation and management by telemedicine and the availability of in person appointments. The patient expressed understanding and agreed to proceed.  History of Present Illness: Subjective:     Hannah Douglas is a 36 y.o. female here for discussion regarding weight loss. She has noted a weight loss of approximately 22 pounds over the last 6 months. She reports she is doing well on the wegovy, denies any side effects to the medication. Feels happy with her results so far. History of eating disorders: none. There is a family history positive for obesity in the patient. Previous treatments for obesity include supervised diet program and Wegovy . Obesity associated medical conditions:  hydradenitis . Obesity associated medications: antidepressants. Cardiovascular risk factors besides obesity: obesity (BMI >= 30 kg/m2).  The following portions of the patient's history were reviewed and updated as appropriate: allergies, current medications, past family history, past medical history, past social history, past surgical history and problem list.    Observations/Objective: Gen: NAD HEENT: NCAT Lungs: no dyspnea noted on visualization of the patient. MSK: moves all extremities normally. Skin: warm and dry, no rash  Neuro: alert and oriented x 3, NAD, smiling and pleasant  Assessment and Plan: 1. Class 3 severe obesity with body mass index (BMI) of 40.0 to 44.9 in adult, unspecified obesity type, unspecified whether serious comorbidity present [facility]) Doing very well on the [facility], is please with her weight loss so far. Will increase her to the 2.4 mg weekly dose and see her back in 3 months  in the office for a weight check.    Follow Up Instructions: Patient is to call the office if she has any side effects from the increased dose of wegovy.     I discussed the assessment and treatment plan with the patient. The patient was provided an opportunity to ask questions and all were answered. The patient agreed with the plan and demonstrated an understanding of the instructions.   The patient was advised to call back or seek an in-person evaluation if the symptoms worsen or if the condition fails to improve as anticipated.    Karie Georges, MD

## 2021-10-17 ENCOUNTER — Encounter: Payer: Self-pay | Admitting: Family Medicine

## 2021-10-17 ENCOUNTER — Telehealth (INDEPENDENT_AMBULATORY_CARE_PROVIDER_SITE_OTHER): Payer: No Typology Code available for payment source | Admitting: Family Medicine

## 2021-10-17 ENCOUNTER — Other Ambulatory Visit (HOSPITAL_COMMUNITY): Payer: Self-pay

## 2021-10-17 DIAGNOSIS — F321 Major depressive disorder, single episode, moderate: Secondary | ICD-10-CM

## 2021-10-17 DIAGNOSIS — E039 Hypothyroidism, unspecified: Secondary | ICD-10-CM

## 2021-10-17 DIAGNOSIS — Z6841 Body Mass Index (BMI) 40.0 and over, adult: Secondary | ICD-10-CM | POA: Diagnosis not present

## 2021-10-17 MED ORDER — SEMAGLUTIDE-WEIGHT MANAGEMENT 2.4 MG/0.75ML ~~LOC~~ SOAJ
2.4000 mg | SUBCUTANEOUS | 5 refills | Status: DC
  Filled 2021-10-17: qty 3, 28d supply, fill #0

## 2021-10-18 ENCOUNTER — Other Ambulatory Visit: Payer: Self-pay | Admitting: Family Medicine

## 2021-10-18 ENCOUNTER — Other Ambulatory Visit (HOSPITAL_COMMUNITY): Payer: Self-pay

## 2021-10-18 MED ORDER — SEMAGLUTIDE-WEIGHT MANAGEMENT 2.4 MG/0.75ML ~~LOC~~ SOAJ
2.4000 mg | SUBCUTANEOUS | 5 refills | Status: DC
  Filled 2021-10-18 – 2021-11-21 (×2): qty 3, 28d supply, fill #0
  Filled 2022-01-06: qty 3, 28d supply, fill #1
  Filled 2022-02-20 – 2022-04-05 (×2): qty 3, 28d supply, fill #2

## 2021-10-25 ENCOUNTER — Other Ambulatory Visit: Payer: Self-pay

## 2021-10-25 ENCOUNTER — Other Ambulatory Visit (HOSPITAL_COMMUNITY): Payer: Self-pay

## 2021-10-27 ENCOUNTER — Other Ambulatory Visit (HOSPITAL_COMMUNITY): Payer: Self-pay

## 2021-10-27 ENCOUNTER — Other Ambulatory Visit: Payer: Self-pay | Admitting: Family Medicine

## 2021-10-27 MED ORDER — LEVOTHYROXINE SODIUM 100 MCG PO TABS
ORAL_TABLET | ORAL | 1 refills | Status: DC
  Filled 2021-10-27: qty 48, 84d supply, fill #0
  Filled 2021-11-06 – 2022-01-09 (×6): qty 48, 84d supply, fill #1
  Filled 2022-04-05: qty 48, 84d supply, fill #2
  Filled 2022-06-30: qty 36, 56d supply, fill #3

## 2021-11-02 ENCOUNTER — Encounter (INDEPENDENT_AMBULATORY_CARE_PROVIDER_SITE_OTHER): Payer: Self-pay

## 2021-11-07 ENCOUNTER — Other Ambulatory Visit (HOSPITAL_COMMUNITY): Payer: Self-pay

## 2021-11-10 ENCOUNTER — Encounter: Payer: Self-pay | Admitting: Family Medicine

## 2021-11-14 ENCOUNTER — Ambulatory Visit: Payer: No Typology Code available for payment source | Admitting: Family Medicine

## 2021-11-16 ENCOUNTER — Other Ambulatory Visit (HOSPITAL_COMMUNITY): Payer: Self-pay

## 2021-11-18 ENCOUNTER — Other Ambulatory Visit (HOSPITAL_COMMUNITY): Payer: Self-pay

## 2021-11-18 ENCOUNTER — Other Ambulatory Visit: Payer: Self-pay

## 2021-11-21 ENCOUNTER — Other Ambulatory Visit (HOSPITAL_COMMUNITY): Payer: Self-pay

## 2021-11-22 ENCOUNTER — Other Ambulatory Visit (HOSPITAL_COMMUNITY): Payer: Self-pay

## 2021-12-13 ENCOUNTER — Other Ambulatory Visit: Payer: Self-pay | Admitting: Family

## 2021-12-13 ENCOUNTER — Other Ambulatory Visit (HOSPITAL_COMMUNITY): Payer: Self-pay

## 2021-12-13 ENCOUNTER — Other Ambulatory Visit: Payer: Self-pay

## 2021-12-13 MED ORDER — LEVOTHYROXINE SODIUM 75 MCG PO TABS
75.0000 ug | ORAL_TABLET | ORAL | 1 refills | Status: DC
  Filled 2021-12-13: qty 36, 84d supply, fill #0
  Filled 2021-12-27 – 2022-04-05 (×2): qty 36, 84d supply, fill #1
  Filled 2022-06-30: qty 36, 84d supply, fill #2

## 2021-12-19 ENCOUNTER — Other Ambulatory Visit (HOSPITAL_COMMUNITY): Payer: Self-pay

## 2021-12-23 ENCOUNTER — Other Ambulatory Visit (HOSPITAL_COMMUNITY): Payer: Self-pay

## 2021-12-26 ENCOUNTER — Other Ambulatory Visit (HOSPITAL_COMMUNITY): Payer: Self-pay

## 2021-12-27 ENCOUNTER — Other Ambulatory Visit (HOSPITAL_COMMUNITY): Payer: Self-pay

## 2022-01-06 ENCOUNTER — Other Ambulatory Visit (HOSPITAL_COMMUNITY): Payer: Self-pay

## 2022-01-09 ENCOUNTER — Other Ambulatory Visit (HOSPITAL_COMMUNITY): Payer: Self-pay

## 2022-02-04 ENCOUNTER — Telehealth: Payer: No Typology Code available for payment source | Admitting: Nurse Practitioner

## 2022-02-04 DIAGNOSIS — J208 Acute bronchitis due to other specified organisms: Secondary | ICD-10-CM

## 2022-02-04 DIAGNOSIS — B9689 Other specified bacterial agents as the cause of diseases classified elsewhere: Secondary | ICD-10-CM

## 2022-02-04 MED ORDER — PREDNISONE 10 MG PO TABS: ORAL_TABLET | ORAL | 0 refills | Status: DC

## 2022-02-04 MED ORDER — AZITHROMYCIN 250 MG PO TABS: ORAL_TABLET | ORAL | 0 refills | Status: AC

## 2022-02-04 NOTE — Patient Instructions (Signed)
[name], thank you for joining Claiborne Rigg, NP for today's virtual visit.  While this provider is not your primary care provider (PCP), if your PCP is located in our provider database this encounter information will be shared with them immediately following your visit.   A [facility] MyChart account gives you access to today's visit and all your visits, tests, and labs performed at [facility] " click here if you don't have a [facility] MyChart account or go to [email or web address]  Consent: (Patient) Hannah Douglas provided verbal consent for this virtual visit at the beginning of the encounter.  Current Medications:  Current Outpatient Medications:    azithromycin (ZITHROMAX) 250 MG tablet, Take 2 tablets on day 1, then 1 tablet daily on days 2 through 5, Disp: 6 tablet, Rfl: 0   predniSONE (DELTASONE) 10 MG tablet, Directions for 6 day taper: Day 1: 2 tablets before breakfast, 1 after both lunch & dinner and 2 at bedtime Day 2: 1 tab before breakfast, 1 after both lunch & dinner and 2 at bedtime Day 3: 1 tab at each meal & 1 at bedtime Day 4: 1 tab at breakfast, 1 at lunch, 1 at bedtime Day 5: 1 tab at breakfast & 1 tab at bedtime Day 6: 1 tab at breakfast, Disp: 21 tablet, Rfl: 0   ALPRAZolam (XANAX) 0.5 MG tablet, Take 1 tablet (0.5 mg total) by mouth daily as needed., Disp: 90 tablet, Rfl: 1   FLUoxetine (PROZAC) 20 MG capsule, Take 1 capsule (20 mg total) by mouth daily with 40 mg capsule to equal 60 mg total., Disp: 90 capsule, Rfl: 4   FLUoxetine (PROZAC) 40 MG capsule, Take 1 capsule (40 mg total) by mouth daily. Take with 20 mg capsule to equal 60 mg total daily., Disp: 90 capsule, Rfl: 4   levothyroxine (SYNTHROID) 100 MCG tablet, Take 1 tablet by mouth  4 days per week. Alternate with levothyroxine ., Disp: 90 tablet, Rfl: 1   levothyroxine (SYNTHROID) 75 MCG tablet, Take 1 tablet (75 mcg total) by mouth 3 (three) times a week. Alternate with  levothyroxine ., Disp: 90 tablet, Rfl: 1   Multiple Vitamin (MULTIVITAMIN) tablet, Take 1 tablet by mouth daily., Disp: , Rfl:    Omega-3 Fatty Acids (FISH OIL) 1000 MG CAPS, Take by mouth 2 (two) times a week., Disp: , Rfl:    [START ON 02/10/2022] Semaglutide-Weight Management 2.4 MG/0.75ML SOAJ, Inject 2.4 mg into the skin once a week., Disp: 3 mL, Rfl: 5   spironolactone (ALDACTONE) 50 MG tablet, Take 1 tablet (50 mg total) by mouth 2 (two) times daily., Disp: 180 tablet, Rfl: 1   Medications ordered in this encounter:  Meds ordered this encounter  Medications   azithromycin (ZITHROMAX) 250 MG tablet    Sig: Take 2 tablets on day 1, then 1 tablet daily on days 2 through 5    Dispense:  6 tablet    Refill:  0    Order Specific Question:   Supervising Provider    Answer:   Merrilee Jansky [[id number]]   predniSONE (DELTASONE) 10 MG tablet    Sig: Directions for 6 day taper: Day 1: 2 tablets before breakfast, 1 after both lunch & dinner and 2 at bedtime Day 2: 1 tab before breakfast, 1 after both lunch & dinner and 2 at bedtime Day 3: 1 tab at each meal & 1 at bedtime Day 4: 1 tab at breakfast, 1 at lunch, 1 at  bedtime Day 5: 1 tab at breakfast & 1 tab at bedtime Day 6: 1 tab at breakfast    Dispense:  21 tablet    Refill:  0    Order Specific Question:   Supervising Provider    Answer:   Merrilee Jansky [[id number]]     *If you need refills on other medications prior to your next appointment, please contact your pharmacy*  Follow-Up: Call back or seek an in-person evaluation if the symptoms worsen or if the condition fails to improve as anticipated.  [facility] [phone]  Other Instructions INSTRUCTIONS: use a humidifier for nasal congestion Drink plenty of fluids, rest and wash hands frequently to avoid the spread of infection Alternate tylenol and Motrin for relief of fever    If you have been instructed to have an in-person evaluation today at a local  Urgent Care facility, please use the link below. It will take you to a list of all of our available [facility], including address, phone number and hours of operation. Please do not delay care.  [facility]  If you or a family member do not have a primary care provider, use the link below to schedule a visit and establish care. When you choose a [facility] primary care physician or advanced practice provider, you gain a long-term partner in health. Find a Primary Care Provider  Learn more about [facility]'s in-office and virtual care options: [facility] - Get Care Now

## 2022-02-04 NOTE — Progress Notes (Signed)
Virtual Visit Consent   Hannah Douglas, you are scheduled for a virtual visit with a [facility] provider today. Just as with appointments in the office, your consent must be obtained to participate. Your consent will be active for this visit and any virtual visit you may have with one of our providers in the next 365 days. If you have a MyChart account, a copy of this consent can be sent to you electronically.  As this is a virtual visit, video technology does not allow for your provider to perform a traditional examination. This may limit your provider's ability to fully assess your condition. If your provider identifies any concerns that need to be evaluated in person or the need to arrange testing (such as labs, EKG, etc.), we will make arrangements to do so. Although advances in technology are sophisticated, we cannot ensure that it will always work on either your end or our end. If the connection with a video visit is poor, the visit may have to be switched to a telephone visit. With either a video or telephone visit, we are not always able to ensure that we have a secure connection.  By engaging in this virtual visit, you consent to the provision of healthcare and authorize for your insurance to be billed (if applicable) for the services provided during this visit. Depending on your insurance coverage, you may receive a charge related to this service.  I need to obtain your verbal consent now. Are you willing to proceed with your visit today? Hannah Douglas has provided verbal consent on 02/04/2022 for a virtual visit (video or telephone). Claiborne Rigg, NP  Date: 02/04/2022 6:22 PM  Virtual Visit via Video Note   I, Claiborne Rigg, connected with  Hannah Douglas  ([id number], 1985-10-25) on 02/04/22 at  6:00 PM EST by a video-enabled telemedicine application and verified that I am speaking with the correct person using two identifiers.  Location: Patient: Virtual Visit Location  Patient: Home Provider: Virtual Visit Location Provider: Home Office   I discussed the limitations of evaluation and management by telemedicine and the availability of in person appointments. The patient expressed understanding and agreed to proceed.    History of Present Illness: Hannah Douglas is a 36 y.o. who identifies as a female who was assigned female at birth, and is being seen today for bronchitis.  Patient presents for presents evaluation of  chest congestion, productive cough or purulent sputum,  sore throat, shortness of breath, fatigue . Symptoms began a few weeks ago and are unchanged since that time.  Past history is significant for no history of pneumonia or bronchitis.  Problems:  Patient Active Problem List   Diagnosis Date Noted   Depression    Hidradenitis suppurativa 08/21/2019   Hypothyroidism 12/03/2013   Obesity, unspecified 12/03/2013    Allergies: No Known Allergies Medications:  Current Outpatient Medications:    azithromycin (ZITHROMAX) 250 MG tablet, Take 2 tablets on day 1, then 1 tablet daily on days 2 through 5, Disp: 6 tablet, Rfl: 0   predniSONE (DELTASONE) 10 MG tablet, Directions for 6 day taper: Day 1: 2 tablets before breakfast, 1 after both lunch & dinner and 2 at bedtime Day 2: 1 tab before breakfast, 1 after both lunch & dinner and 2 at bedtime Day 3: 1 tab at each meal & 1 at bedtime Day 4: 1 tab at breakfast, 1 at lunch, 1 at bedtime Day 5: 1 tab at breakfast &  1 tab at bedtime Day 6: 1 tab at breakfast, Disp: 21 tablet, Rfl: 0   ALPRAZolam (XANAX) 0.5 MG tablet, Take 1 tablet (0.5 mg total) by mouth daily as needed., Disp: 90 tablet, Rfl: 1   FLUoxetine (PROZAC) 20 MG capsule, Take 1 capsule (20 mg total) by mouth daily with 40 mg capsule to equal 60 mg total., Disp: 90 capsule, Rfl: 4   FLUoxetine (PROZAC) 40 MG capsule, Take 1 capsule (40 mg total) by mouth daily. Take with 20 mg capsule to equal 60 mg total daily., Disp: 90 capsule, Rfl: 4    levothyroxine (SYNTHROID) 100 MCG tablet, Take 1 tablet by mouth  4 days per week. Alternate with levothyroxine ., Disp: 90 tablet, Rfl: 1   levothyroxine (SYNTHROID) 75 MCG tablet, Take 1 tablet (75 mcg total) by mouth 3 (three) times a week. Alternate with levothyroxine ., Disp: 90 tablet, Rfl: 1   Multiple Vitamin (MULTIVITAMIN) tablet, Take 1 tablet by mouth daily., Disp: , Rfl:    Omega-3 Fatty Acids (FISH OIL) 1000 MG CAPS, Take by mouth 2 (two) times a week., Disp: , Rfl:    [START ON 02/10/2022] Semaglutide-Weight Management 2.4 MG/0.75ML SOAJ, Inject 2.4 mg into the skin once a week., Disp: 3 mL, Rfl: 5   spironolactone (ALDACTONE) 50 MG tablet, Take 1 tablet (50 mg total) by mouth 2 (two) times daily., Disp: 180 tablet, Rfl: 1  Observations/Objective: Patient is well-developed, well-nourished in no acute distress.  Resting comfortably at home.  Head is normocephalic, atraumatic.  No labored breathing.  Speech is clear and coherent with logical content.  Patient is alert and oriented at baseline.    Assessment and Plan: 1. Acute bacterial bronchitis - azithromycin (ZITHROMAX) 250 MG tablet; Take 2 tablets on day 1, then 1 tablet daily on days 2 through 5  Dispense: 6 tablet; Refill: 0 - predniSONE (DELTASONE) 10 MG tablet; Directions for 6 day taper: Day 1: 2 tablets before breakfast, 1 after both lunch & dinner and 2 at bedtime Day 2: 1 tab before breakfast, 1 after both lunch & dinner and 2 at bedtime Day 3: 1 tab at each meal & 1 at bedtime Day 4: 1 tab at breakfast, 1 at lunch, 1 at bedtime Day 5: 1 tab at breakfast & 1 tab at bedtime Day 6: 1 tab at breakfast  Dispense: 21 tablet; Refill: 0 INSTRUCTIONS: use a humidifier for nasal congestion Drink plenty of fluids, rest and wash hands frequently to avoid the spread of infection Alternate tylenol and Motrin for relief of fever   Follow Up Instructions: I discussed the assessment and treatment plan with the patient.  The patient was provided an opportunity to ask questions and all were answered. The patient agreed with the plan and demonstrated an understanding of the instructions.  A copy of instructions were sent to the patient via MyChart unless otherwise noted below.    The patient was advised to call back or seek an in-person evaluation if the symptoms worsen or if the condition fails to improve as anticipated.  Time:  I spent 11 minutes with the patient via telehealth technology discussing the above problems/concerns.    Claiborne Rigg, NP

## 2022-02-08 ENCOUNTER — Encounter: Payer: Self-pay | Admitting: Family Medicine

## 2022-02-08 ENCOUNTER — Ambulatory Visit (INDEPENDENT_AMBULATORY_CARE_PROVIDER_SITE_OTHER): Payer: No Typology Code available for payment source | Admitting: Family Medicine

## 2022-02-08 VITALS — BP 102/84 | HR 74 | Temp 98.1°F | Wt 231.0 lb

## 2022-02-08 DIAGNOSIS — J209 Acute bronchitis, unspecified: Secondary | ICD-10-CM

## 2022-02-08 DIAGNOSIS — R0789 Other chest pain: Secondary | ICD-10-CM

## 2022-02-08 DIAGNOSIS — J069 Acute upper respiratory infection, unspecified: Secondary | ICD-10-CM | POA: Diagnosis not present

## 2022-02-08 DIAGNOSIS — M940 Chondrocostal junction syndrome [Tietze]: Secondary | ICD-10-CM | POA: Diagnosis not present

## 2022-02-08 NOTE — Progress Notes (Signed)
Subjective:    Patient ID: Hannah Douglas, female    DOB: 06-Oct-1985, 36 y.o.   MRN: [id number]  Chief Complaint  Patient presents with   Cough    Cough, congestion and fatigue for over a wk, pain in chest whn she takes a deep breath. Did do a rapid covid test came back neg, had a virtual visit and got the prednisone taper and zpak.     HPI Patient is a 36 year old female with history of hypothyroidism followed by Dr. Casimiro Needle and seen today for ongoing concern.  Symptoms started with congestion, sore throat, fatigue x 1 wk.  Mid chest pressure with taking a deep breath in started yesterday.  Pt seen virtually 11/11, given zpak and prednisone.  Pt stopped taking the prednisone after a few doses as she had difficulty sleeping.  Pt also tried nyquil, DayQuil, hot tea with honey, Mucinex, and ibuprofen.  Past Medical History:  Diagnosis Date   Anxiety    Depression    Hypothyroidism     No Known Allergies  ROS General: Denies fever, chills, night sweats, changes in weight, changes in appetite HEENT: Denies headaches, ear pain, changes in vision, rhinorrhea + congestion, sore throat-resolved CV: Denies CP, palpitations, SOB, orthopnea   Pulm: Denies SOB, cough, wheezing + cough, chest pressure with inhalation GI: Denies abdominal pain, nausea, vomiting, diarrhea, constipation GU: Denies dysuria, hematuria, frequency, vaginal discharge Msk: Denies muscle cramps, joint pains Neuro: Denies weakness, numbness, tingling Skin: Denies rashes, bruising Psych: Denies depression, anxiety, hallucinations     Objective:    Blood pressure 102/84, pulse 74, temperature 98.1 F (36.7 C), temperature source Oral, weight 231 lb (104.8 kg), SpO2 97 %.   Gen. Pleasant, well-nourished, in no distress, normal affect   HEENT: [place]/AT, face symmetric, conjunctiva clear, no scleral icterus, PERRLA, EOMI, nares patent without drainage, pharynx without erythema or exudate.  TMs full bilaterally. Lungs:  Intermittent cough, no accessory muscle use, CTAB, no wheezes or rales Cardiovascular: RRR, no m/r/g, no peripheral edema Musculoskeletal: TTP of sternum.  No deformities, no cyanosis or clubbing, normal tone Neuro:  A&Ox3, CN II-XII intact, normal gait Skin:  Warm, no lesions/ rash   Wt Readings from Last 3 Encounters:  02/08/22 231 lb (104.8 kg)  08/05/21 241 lb 6 oz (109.5 kg)  08/04/20 245 lb 3.2 oz (111.2 kg)    Lab Results  Component Value Date   WBC 8.0 08/04/2021   HGB 13.6 08/04/2021   HCT 38.9 08/04/2021   PLT 305.0 08/04/2021   GLUCOSE 87 08/04/2021   CHOL 170 08/04/2021   TRIG 135.0 08/04/2021   HDL 36.90 (L) 08/04/2021   LDLCALC 106 (H) 08/04/2021   ALT 35 08/04/2021   AST 21 08/04/2021   NA 136 08/04/2021   K 4.1 08/04/2021   CL 104 08/04/2021   CREATININE 0.64 08/04/2021   BUN 9 08/04/2021   CO2 24 08/04/2021   TSH 3.30 08/04/2021   HGBA1C 4.9 06/09/2019    Assessment/Plan:  Acute bronchitis, unspecified organism  Costochondritis  Chest pressure  Upper respiratory tract infection, unspecified type  Symptoms likely 2/2 acute bronchitis initially caused by URI.  Patient encouraged to complete antibiotic course.  Okay to stop prednisone as caused insomnia and no reported history of wheezing.  Continue expectant management of current symptoms.  Sensation of chest pressure possibly related to costochondritis.  Also consider GERD.  Less concerning for PE as without tachycardia but to be considered for continued or worsening symptoms.  For  continued symptoms at the end of the week/early next week obtain CXR.  Given strict precautions.  F/u as needed  Abbe Amsterdam, MD

## 2022-02-20 ENCOUNTER — Other Ambulatory Visit (HOSPITAL_BASED_OUTPATIENT_CLINIC_OR_DEPARTMENT_OTHER): Payer: Self-pay

## 2022-02-21 ENCOUNTER — Telehealth: Payer: No Typology Code available for payment source | Admitting: Family Medicine

## 2022-02-21 DIAGNOSIS — U071 COVID-19: Secondary | ICD-10-CM | POA: Diagnosis not present

## 2022-02-21 MED ORDER — FLUTICASONE PROPIONATE 50 MCG/ACT NA SUSP: 2.0000 | Freq: Every day | NASAL | 0 refills | Status: DC

## 2022-02-21 MED ORDER — PROMETHAZINE-DM 6.25-15 MG/5ML PO SYRP: 5.0000 mL | ORAL_SOLUTION | Freq: Four times a day (QID) | ORAL | 0 refills | Status: DC | PRN

## 2022-02-21 MED ORDER — BENZONATATE 100 MG PO CAPS: 200.0000 mg | ORAL_CAPSULE | Freq: Two times a day (BID) | ORAL | 0 refills | Status: DC | PRN

## 2022-02-21 NOTE — Progress Notes (Signed)
Virtual Visit Consent   Hannah Douglas, you are scheduled for a virtual visit with a [facility] provider today. Just as with appointments in the office, your consent must be obtained to participate. Your consent will be active for this visit and any virtual visit you may have with one of our providers in the next 365 days. If you have a MyChart account, a copy of this consent can be sent to you electronically.  As this is a virtual visit, video technology does not allow for your provider to perform a traditional examination. This may limit your provider's ability to fully assess your condition. If your provider identifies any concerns that need to be evaluated in person or the need to arrange testing (such as labs, EKG, etc.), we will make arrangements to do so. Although advances in technology are sophisticated, we cannot ensure that it will always work on either your end or our end. If the connection with a video visit is poor, the visit may have to be switched to a telephone visit. With either a video or telephone visit, we are not always able to ensure that we have a secure connection.  By engaging in this virtual visit, you consent to the provision of healthcare and authorize for your insurance to be billed (if applicable) for the services provided during this visit. Depending on your insurance coverage, you may receive a charge related to this service.  I need to obtain your verbal consent now. Are you willing to proceed with your visit today? Hannah Douglas has provided verbal consent on 02/21/2022 for a virtual visit (video or telephone). Freddy Finner, NP  Date: 02/21/2022 3:47 PM  Virtual Visit via Video Note   I, Freddy Finner, connected with  Hannah Douglas  ([id number], 1986/01/04) on 02/21/22 at  3:45 PM EST by a video-enabled telemedicine application and verified that I am speaking with the correct person using two identifiers.  Location: Patient: Virtual Visit Location  Patient: Home Provider: Virtual Visit Location Provider: Home Office   I discussed the limitations of evaluation and management by telemedicine and the availability of in person appointments. The patient expressed understanding and agreed to proceed.    History of Present Illness: Hannah Douglas is a 36 y.o. who identifies as a female who was assigned female at birth, and is being seen today for covid + tested positive on home test.  Symptoms started at the morning with body pain, chest discomfort-rib cage pain, winded walking around, fatigue, and flu like- fever 101.7-  sore throat. Is just getting over bad episode of bronchitis never got over there cough before this developed. Denies ear pain.  COVID- vaccinated but no recent booster.  Flu vaccine   Problems:  Patient Active Problem List   Diagnosis Date Noted   Depression    Hidradenitis suppurativa 08/21/2019   Hypothyroidism 12/03/2013   Obesity, unspecified 12/03/2013    Allergies: No Known Allergies Medications:  Current Outpatient Medications:    ALPRAZolam (XANAX) 0.5 MG tablet, Take 1 tablet (0.5 mg total) by mouth daily as needed., Disp: 90 tablet, Rfl: 1   FLUoxetine (PROZAC) 20 MG capsule, Take 1 capsule (20 mg total) by mouth daily with 40 mg capsule to equal 60 mg total., Disp: 90 capsule, Rfl: 4   FLUoxetine (PROZAC) 40 MG capsule, Take 1 capsule (40 mg total) by mouth daily. Take with 20 mg capsule to equal 60 mg total daily., Disp: 90 capsule, Rfl: 4  levothyroxine (SYNTHROID) 100 MCG tablet, Take 1 tablet by mouth  4 days per week. Alternate with levothyroxine ., Disp: 90 tablet, Rfl: 1   levothyroxine (SYNTHROID) 75 MCG tablet, Take 1 tablet (75 mcg total) by mouth 3 (three) times a week. Alternate with levothyroxine ., Disp: 90 tablet, Rfl: 1   Multiple Vitamin (MULTIVITAMIN) tablet, Take 1 tablet by mouth daily., Disp: , Rfl:    Omega-3 Fatty Acids (FISH OIL) 1000 MG CAPS, Take by mouth 2 (two) times  a week., Disp: , Rfl:    predniSONE (DELTASONE) 10 MG tablet, Directions for 6 day taper: Day 1: 2 tablets before breakfast, 1 after both lunch & dinner and 2 at bedtime Day 2: 1 tab before breakfast, 1 after both lunch & dinner and 2 at bedtime Day 3: 1 tab at each meal & 1 at bedtime Day 4: 1 tab at breakfast, 1 at lunch, 1 at bedtime Day 5: 1 tab at breakfast & 1 tab at bedtime Day 6: 1 tab at breakfast, Disp: 21 tablet, Rfl: 0   Semaglutide-Weight Management 2.4 MG/0.75ML SOAJ, Inject 2.4 mg into the skin once a week., Disp: 3 mL, Rfl: 5   spironolactone (ALDACTONE) 50 MG tablet, Take 1 tablet (50 mg total) by mouth 2 (two) times daily., Disp: 180 tablet, Rfl: 1  Observations/Objective: Patient is well-developed, well-nourished in no acute distress.  Resting comfortably  at home.  Head is normocephalic, atraumatic.  No labored breathing.  Speech is clear and coherent with logical content.  Patient is alert and oriented at baseline.    Assessment and Plan:  1. COVID-19  - promethazine-dextromethorphan (PROMETHAZINE-DM) 6.25-15 MG/5ML syrup; Take 5 mLs by mouth 4 (four) times daily as needed for cough.  Dispense: 118 mL; Refill: 0 - benzonatate (TESSALON) 100 MG capsule; Take 2 capsules (200 mg total) by mouth 2 (two) times daily as needed for cough.  Dispense: 20 capsule; Refill: 0 - fluticasone (FLONASE) 50 MCG/ACT nasal spray; Place 2 sprays into both nostrils daily.  Dispense: 16 g; Refill: 0  -covid info reviewed and on AVS -OTC reviewed and on AVS  -in person precautions reviewed   Reviewed side effects, risks and benefits of medication.    Patient acknowledged agreement and understanding of the plan.   Past Medical, Surgical, Social History, Allergies, and Medications have been Reviewed.    Follow Up Instructions: I discussed the assessment and treatment plan with the patient. The patient was provided an opportunity to ask questions and all were answered. The patient  agreed with the plan and demonstrated an understanding of the instructions.  A copy of instructions were sent to the patient via MyChart unless otherwise noted below.     The patient was advised to call back or seek an in-person evaluation if the symptoms worsen or if the condition fails to improve as anticipated.  Time:  I spent 10 minutes with the patient via telehealth technology discussing the above problems/concerns.    Freddy Finner, NP

## 2022-02-21 NOTE — Patient Instructions (Signed)
Hannah Douglas, thank you for joining Hannah Finner, NP for today's virtual visit.  While this provider is not your primary care provider (PCP), if your PCP is located in our provider database this encounter information will be shared with them immediately following your visit.   A [facility] MyChart account gives you access to today's visit and all your visits, tests, and labs performed at [facility] " click here if you don't have a [facility] MyChart account or go to [email or web address]  Consent: (Patient) Hannah Douglas provided verbal consent for this virtual visit at the beginning of the encounter.  Current Medications:  Current Outpatient Medications:    benzonatate (TESSALON) 100 MG capsule, Take 2 capsules (200 mg total) by mouth 2 (two) times daily as needed for cough., Disp: 20 capsule, Rfl: 0   fluticasone (FLONASE) 50 MCG/ACT nasal spray, Place 2 sprays into both nostrils daily., Disp: 16 g, Rfl: 0   promethazine-dextromethorphan (PROMETHAZINE-DM) 6.25-15 MG/5ML syrup, Take 5 mLs by mouth 4 (four) times daily as needed for cough., Disp: 118 mL, Rfl: 0   ALPRAZolam (XANAX) 0.5 MG tablet, Take 1 tablet (0.5 mg total) by mouth daily as needed., Disp: 90 tablet, Rfl: 1   FLUoxetine (PROZAC) 20 MG capsule, Take 1 capsule (20 mg total) by mouth daily with 40 mg capsule to equal 60 mg total., Disp: 90 capsule, Rfl: 4   FLUoxetine (PROZAC) 40 MG capsule, Take 1 capsule (40 mg total) by mouth daily. Take with 20 mg capsule to equal 60 mg total daily., Disp: 90 capsule, Rfl: 4   levothyroxine (SYNTHROID) 100 MCG tablet, Take 1 tablet by mouth  4 days per week. Alternate with levothyroxine ., Disp: 90 tablet, Rfl: 1   levothyroxine (SYNTHROID) 75 MCG tablet, Take 1 tablet (75 mcg total) by mouth 3 (three) times a week. Alternate with levothyroxine ., Disp: 90 tablet, Rfl: 1   Multiple Vitamin (MULTIVITAMIN) tablet, Take 1 tablet by mouth daily., Disp: , Rfl:     Omega-3 Fatty Acids (FISH OIL) 1000 MG CAPS, Take by mouth 2 (two) times a week., Disp: , Rfl:    predniSONE (DELTASONE) 10 MG tablet, Directions for 6 day taper: Day 1: 2 tablets before breakfast, 1 after both lunch & dinner and 2 at bedtime Day 2: 1 tab before breakfast, 1 after both lunch & dinner and 2 at bedtime Day 3: 1 tab at each meal & 1 at bedtime Day 4: 1 tab at breakfast, 1 at lunch, 1 at bedtime Day 5: 1 tab at breakfast & 1 tab at bedtime Day 6: 1 tab at breakfast, Disp: 21 tablet, Rfl: 0   Semaglutide-Weight Management 2.4 MG/0.75ML SOAJ, Inject 2.4 mg into the skin once a week., Disp: 3 mL, Rfl: 5   spironolactone (ALDACTONE) 50 MG tablet, Take 1 tablet (50 mg total) by mouth 2 (two) times daily., Disp: 180 tablet, Rfl: 1   Medications ordered in this encounter:  Meds ordered this encounter  Medications   promethazine-dextromethorphan (PROMETHAZINE-DM) 6.25-15 MG/5ML syrup    Sig: Take 5 mLs by mouth 4 (four) times daily as needed for cough.    Dispense:  118 mL    Refill:  0    Order Specific Question:   Supervising Provider    Answer:   Merrilee Jansky [[id number]]   benzonatate (TESSALON) 100 MG capsule    Sig: Take 2 capsules (200 mg total) by mouth 2 (two) times daily as needed for cough.  Dispense:  20 capsule    Refill:  0    Order Specific Question:   Supervising Provider    Answer:   Merrilee Jansky [[id number]]   fluticasone (FLONASE) 50 MCG/ACT nasal spray    Sig: Place 2 sprays into both nostrils daily.    Dispense:  16 g    Refill:  0    Order Specific Question:   Supervising Provider    Answer:   Merrilee Jansky [id number]     *If you need refills on other medications prior to your next appointment, please contact your pharmacy*  Follow-Up: Call back or seek an in-person evaluation if the symptoms worsen or if the condition fails to improve as anticipated.  [facility] [phone]  Other Instructions  Please keep well-hydrated  and get plenty of rest. Start a saline nasal rinse to flush out your nasal passages. You can use plain Mucinex to help thin congestion. If you have a humidifier, running in the bedroom at night. I want you to start OTC vitamin D3 1000 units daily, vitamin C 1000 mg daily, and a zinc supplement. Please take prescribed medications as directed.  You have been enrolled in a MyChart symptom monitoring program. Please answer these questions daily so we can keep track of how you are doing.  You were to quarantine for 5 days from onset of your symptoms.  After day 5, if you have had no fever and you are feeling better, you can end quarantine but need to mask for an additional 5 days. After day 5 if you have a fever or are having significant symptoms, please quarantine for full 10 days.  If you note any worsening of symptoms, any significant shortness of breath or any chest pain, please seek ER evaluation ASAP.  Please do not delay care!  COVID-19: What to Do if You Are Sick If you test positive and are an older adult or someone who is at high risk of getting very sick from COVID-19, treatment may be available. Contact a healthcare provider right away after a positive test to determine if you are eligible, even if your symptoms are mild right now. You can also visit a Test to Treat location and, if eligible, receive a prescription from a provider. Don't delay: Treatment must be started within the first few days to be effective. If you have a fever, cough, or other symptoms, you might have COVID-19. Most people have mild illness and are able to recover at home. If you are sick: Keep track of your symptoms. If you have an emergency warning sign (including trouble breathing), call 911. Steps to help prevent the spread of COVID-19 if you are sick If you are sick with COVID-19 or think you might have COVID-19, follow the steps below to care for yourself and to help protect other people in your home and  community. Stay home except to get medical care Stay home. Most people with COVID-19 have mild illness and can recover at home without medical care. Do not leave your home, except to get medical care. Do not visit public areas and do not go to places where you are unable to wear a mask. Take care of yourself. Get rest and stay hydrated. Take over-the-counter medicines, such as acetaminophen, to help you feel better. Stay in touch with your doctor. Call before you get medical care. Be sure to get care if you have trouble breathing, or have any other emergency warning signs, or if  you think it is an emergency. Avoid public transportation, ride-sharing, or taxis if possible. Get tested If you have symptoms of COVID-19, get tested. While waiting for test results, stay away from others, including staying apart from those living in your household. Get tested as soon as possible after your symptoms start. Treatments may be available for people with COVID-19 who are at risk for becoming very sick. Don't delay: Treatment must be started early to be effective--some treatments must begin within 5 days of your first symptoms. Contact your healthcare provider right away if your test result is positive to determine if you are eligible. Self-tests are one of several options for testing for the virus that causes COVID-19 and may be more convenient than laboratory-based tests and point-of-care tests. Ask your healthcare provider or your local health department if you need help interpreting your test results. You can visit your state, tribal, local, and territorial health department's website to look for the latest local information on testing sites. Separate yourself from other people As much as possible, stay in a specific room and away from other people and pets in your home. If possible, you should use a separate bathroom. If you need to be around other people or animals in or outside of the home, wear a well-fitting  mask. Tell your close contacts that they may have been exposed to COVID-19. An infected person can spread COVID-19 starting 48 hours (or 2 days) before the person has any symptoms or tests positive. By letting your close contacts know they may have been exposed to COVID-19, you are helping to protect everyone. See COVID-19 and Animals if you have questions about pets. If you are diagnosed with COVID-19, someone from the health department may call you. Answer the call to slow the spread. Monitor your symptoms Symptoms of COVID-19 include fever, cough, or other symptoms. Follow care instructions from your healthcare provider and local health department. Your local health authorities may give instructions on checking your symptoms and reporting information. When to seek emergency medical attention Look for emergency warning signs* for COVID-19. If someone is showing any of these signs, seek emergency medical care immediately: Trouble breathing Persistent pain or pressure in the chest New confusion Inability to wake or stay awake Pale, gray, or blue-colored skin, lips, or nail beds, depending on skin tone *This list is not all possible symptoms. Please call your medical provider for any other symptoms that are severe or concerning to you. Call 911 or call ahead to your local emergency facility: Notify the operator that you are seeking care for someone who has or may have COVID-19. Call ahead before visiting your doctor Call ahead. Many medical visits for routine care are being postponed or done by phone or telemedicine. If you have a medical appointment that cannot be postponed, call your doctor's office, and tell them you have or may have COVID-19. This will help the office protect themselves and other patients. If you are sick, wear a well-fitting mask You should wear a mask if you must be around other people or animals, including pets (even at home). Wear a mask with the best fit, protection, and  comfort for you. You don't need to wear the mask if you are alone. If you can't put on a mask (because of trouble breathing, for example), cover your coughs and sneezes in some other way. Try to stay at least 6 feet away from other people. This will help protect the people around you. Masks should not be  placed on young children under age 70 years, anyone who has trouble breathing, or anyone who is not able to remove the mask without help. Cover your coughs and sneezes Cover your mouth and nose with a tissue when you cough or sneeze. Throw away used tissues in a lined trash can. Immediately wash your hands with soap and water for at least 20 seconds. If soap and water are not available, clean your hands with an alcohol-based hand sanitizer that contains at least 60% alcohol. Clean your hands often Wash your hands often with soap and water for at least 20 seconds. This is especially important after blowing your nose, coughing, or sneezing; going to the bathroom; and before eating or preparing food. Use hand sanitizer if soap and water are not available. Use an alcohol-based hand sanitizer with at least 60% alcohol, covering all surfaces of your hands and rubbing them together until they feel dry. Soap and water are the best option, especially if hands are visibly dirty. Avoid touching your eyes, nose, and mouth with unwashed hands. Handwashing Tips Avoid sharing personal household items Do not share dishes, drinking glasses, cups, eating utensils, towels, or bedding with other people in your home. Wash these items thoroughly after using them with soap and water or put in the dishwasher. Clean surfaces in your home regularly Clean and disinfect high-touch surfaces (for example, doorknobs, tables, handles, light switches, and countertops) in your "sick room" and bathroom. In shared spaces, you should clean and disinfect surfaces and items after each use by the person who is ill. If you are sick and  cannot clean, a caregiver or other person should only clean and disinfect the area around you (such as your bedroom and bathroom) on an as needed basis. Your caregiver/other person should wait as long as possible (at least several hours) and wear a mask before entering, cleaning, and disinfecting shared spaces that you use. Clean and disinfect areas that may have blood, stool, or body fluids on them. Use household cleaners and disinfectants. Clean visible dirty surfaces with household cleaners containing soap or detergent. Then, use a household disinfectant. Use a product from [employer]: Disinfectants for Coronavirus (COVID-19). Be sure to follow the instructions on the label to ensure safe and effective use of the product. Many products recommend keeping the surface wet with a disinfectant for a certain period of time (look at "contact time" on the product label). You may also need to wear personal protective equipment, such as gloves, depending on the directions on the product label. Immediately after disinfecting, wash your hands with soap and water for 20 seconds. For completed guidance on cleaning and disinfecting your home, visit Complete Disinfection Guidance. Take steps to improve ventilation at home Improve ventilation (air flow) at home to help prevent from spreading COVID-19 to other people in your household. Clear out COVID-19 virus particles in the air by opening windows, using air filters, and turning on fans in your home. Use this interactive tool to learn how to improve air flow in your home. When you can be around others after being sick with COVID-19 Deciding when you can be around others is different for different situations. Find out when you can safely end home isolation. For any additional questions about your care, contact your healthcare provider or state or local health department. 06/15/2020 Content source: [facility]  ([facility]), Division of Viral Diseases This information is not intended to replace advice given to you by your  health care provider. Make sure you discuss any questions you have with your health care provider. Document Revised: 07/29/2020 Document Reviewed: 07/29/2020 Elsevier Patient Education  2022 [employer].     If you have been instructed to have an in-person evaluation today at a local Urgent Care facility, please use the link below. It will take you to a list of all of our available [facility], including address, phone number and hours of operation. Please do not delay care.  [facility]  If you or a family member do not have a primary care provider, use the link below to schedule a visit and establish care. When you choose a [facility] primary care physician or advanced practice provider, you gain a long-term partner in health. Find a Primary Care Provider  Learn more about [facility]'s in-office and virtual care options: [facility] - Get Care Now

## 2022-03-07 ENCOUNTER — Other Ambulatory Visit (HOSPITAL_COMMUNITY): Payer: Self-pay

## 2022-03-08 ENCOUNTER — Other Ambulatory Visit (HOSPITAL_BASED_OUTPATIENT_CLINIC_OR_DEPARTMENT_OTHER): Payer: Self-pay

## 2022-04-05 ENCOUNTER — Other Ambulatory Visit (HOSPITAL_BASED_OUTPATIENT_CLINIC_OR_DEPARTMENT_OTHER): Payer: Self-pay

## 2022-04-06 ENCOUNTER — Other Ambulatory Visit: Payer: Self-pay

## 2022-04-06 ENCOUNTER — Other Ambulatory Visit (HOSPITAL_BASED_OUTPATIENT_CLINIC_OR_DEPARTMENT_OTHER): Payer: Self-pay

## 2022-04-26 DIAGNOSIS — L732 Hidradenitis suppurativa: Secondary | ICD-10-CM | POA: Diagnosis not present

## 2022-04-26 DIAGNOSIS — L910 Hypertrophic scar: Secondary | ICD-10-CM | POA: Diagnosis not present

## 2022-05-03 ENCOUNTER — Other Ambulatory Visit (HOSPITAL_BASED_OUTPATIENT_CLINIC_OR_DEPARTMENT_OTHER): Payer: Self-pay

## 2022-05-03 MED ORDER — TRETINOIN 0.05 % EX CREA
1.0000 | TOPICAL_CREAM | Freq: Every day | CUTANEOUS | 1 refills | Status: AC | PRN
Start: 2022-05-03 — End: ?
  Filled 2022-05-03: qty 45, 30d supply, fill #0

## 2022-05-08 DIAGNOSIS — F4323 Adjustment disorder with mixed anxiety and depressed mood: Secondary | ICD-10-CM | POA: Diagnosis not present

## 2022-05-15 ENCOUNTER — Encounter: Payer: Self-pay | Admitting: Family Medicine

## 2022-05-22 DIAGNOSIS — F4323 Adjustment disorder with mixed anxiety and depressed mood: Secondary | ICD-10-CM | POA: Diagnosis not present

## 2022-05-25 ENCOUNTER — Encounter: Payer: Self-pay | Admitting: Family Medicine

## 2022-05-25 ENCOUNTER — Other Ambulatory Visit (HOSPITAL_BASED_OUTPATIENT_CLINIC_OR_DEPARTMENT_OTHER): Payer: Self-pay

## 2022-05-25 ENCOUNTER — Ambulatory Visit (INDEPENDENT_AMBULATORY_CARE_PROVIDER_SITE_OTHER): Payer: 59 | Admitting: Family Medicine

## 2022-05-25 VITALS — BP 100/62 | HR 77 | Temp 98.1°F | Ht 64.75 in | Wt 247.6 lb

## 2022-05-25 DIAGNOSIS — E039 Hypothyroidism, unspecified: Secondary | ICD-10-CM | POA: Diagnosis not present

## 2022-05-25 DIAGNOSIS — R5383 Other fatigue: Secondary | ICD-10-CM | POA: Diagnosis not present

## 2022-05-25 DIAGNOSIS — G44229 Chronic tension-type headache, not intractable: Secondary | ICD-10-CM

## 2022-05-25 MED ORDER — IBUPROFEN 800 MG PO TABS
800.0000 mg | ORAL_TABLET | Freq: Three times a day (TID) | ORAL | 5 refills | Status: DC | PRN
  Filled 2022-05-25: qty 30, 10d supply, fill #0

## 2022-05-25 NOTE — Progress Notes (Signed)
Acute Office Visit  Subjective:     Patient ID: [name], female    DOB: [birth year], 37 y.o.   MRN: [id number]  Chief Complaint  Patient presents with   Fatigue    X3-4 weeks   Headache    X3-4 weeks, tried Ibuprofen with some relief    Headache    Patient is in today for physical fatigue, no matter how much she has slept. States that it is not related to stress or hydration.   States that she has been having and increased skin rashes and cold intolerance-- states that when she was first diagnosed with hypothyroid she has these same patches of really dry and flaky skin.   States that she would wake up with a headache in the morning. Then it would subside for a while then return mid afternoon.   Review of Systems  Neurological:  Positive for headaches.  All other systems reviewed and are negative.       Objective:    BP 100/62 (BP Location: Right Arm, Patient Position: Sitting, Cuff Size: Large)   Pulse 77   Temp 98.1 F (36.7 C) (Oral)   Ht 5' 4.75" (1.645 m)   Wt 247 lb 9.6 oz (112.3 kg)   LMP 05/10/2022   SpO2 97%   BMI 41.52 kg/m    Physical Exam Vitals reviewed.  Constitutional:      Appearance: Normal appearance. She is well-groomed. She is morbidly obese.  Eyes:     Conjunctiva/sclera: Conjunctivae normal.  Neck:     Thyroid: No thyromegaly.  Cardiovascular:     Rate and Rhythm: Normal rate and regular rhythm.     Pulses: Normal pulses.     Heart sounds: S1 normal and S2 normal.  Pulmonary:     Effort: Pulmonary effort is normal.     Breath sounds: Normal breath sounds and air entry.  Musculoskeletal:     Right lower leg: No edema.     Left lower leg: No edema.  Neurological:     Mental Status: She is alert and oriented to person, place, and time. Mental status is at baseline.     Gait: Gait is intact.  Psychiatric:        Mood and Affect: Mood and affect normal.        Speech: Speech normal.        Behavior: Behavior normal.         Judgment: Judgment normal.     No results found for any visits on 05/25/22.      Assessment & Plan:   Problem List Items Addressed This Visit       Unprioritized   Hypothyroidism - Primary    Might be related to her thyroid, will check new TSH to check. Continue 100/75 mcg alternating doses for now until results are known.      Relevant Orders   TSH   Other Visit Diagnoses     Other fatigue       Relevant Orders   Pt not really describing daytime somnolence-- will check vitamin levels  and TSH, A1C to rule out any insulin resistance. If these are negative then will send for sleep study to rule out OSA  B12 and Folate Panel   Vitamin D, 25-hydroxy   Hemoglobin A1c   Chronic tension-type headache, not intractable       Relevant Medications   Will treat with ibuprofen PRN, see below  ibuprofen (ADVIL) 800 MG tablet  Meds ordered this encounter  Medications   ibuprofen (ADVIL) 800 MG tablet    Sig: Take 1 tablet (800 mg total) by mouth every 8 (eight) hours as needed.    Dispense:  30 tablet    Refill:  5    Return in about 3 months (around 08/23/2022) for annual physical exam.  Farrel Conners, MD

## 2022-05-25 NOTE — Assessment & Plan Note (Signed)
Might be related to her thyroid, will check new TSH to check. Continue 100/75 mcg alternating doses for now until results are known.

## 2022-05-26 ENCOUNTER — Other Ambulatory Visit: Payer: 59

## 2022-05-26 DIAGNOSIS — R5383 Other fatigue: Secondary | ICD-10-CM

## 2022-05-26 DIAGNOSIS — E039 Hypothyroidism, unspecified: Secondary | ICD-10-CM

## 2022-05-26 NOTE — Addendum Note (Signed)
Addended by: Rosalyn Gess D on: 05/26/2022 04:20 PM   Modules accepted: Orders

## 2022-05-27 LAB — B12 AND FOLATE PANEL
Folate: >24 ng/mL
Vitamin B-12: 522 pg/mL (ref 200–1100)

## 2022-05-27 LAB — HEMOGLOBIN A1C
Hgb A1c MFr Bld: 5.4 % of total Hgb (ref <5.7)
Mean Plasma Glucose: 108 mg/dL
eAG (mmol/L): 6 mmol/L

## 2022-05-27 LAB — VITAMIN D 25 HYDROXY (VIT D DEFICIENCY, FRACTURES): Vit D, 25-Hydroxy: 28 ng/mL — ABNORMAL LOW (ref 30–100)

## 2022-05-27 LAB — TSH: TSH: 4.27 mIU/L

## 2022-05-30 ENCOUNTER — Encounter: Payer: Self-pay | Admitting: Family Medicine

## 2022-06-01 ENCOUNTER — Other Ambulatory Visit (HOSPITAL_BASED_OUTPATIENT_CLINIC_OR_DEPARTMENT_OTHER): Payer: Self-pay

## 2022-06-16 ENCOUNTER — Other Ambulatory Visit: Payer: Self-pay

## 2022-06-30 ENCOUNTER — Other Ambulatory Visit (HOSPITAL_BASED_OUTPATIENT_CLINIC_OR_DEPARTMENT_OTHER): Payer: Self-pay

## 2022-07-07 DIAGNOSIS — F4323 Adjustment disorder with mixed anxiety and depressed mood: Secondary | ICD-10-CM | POA: Diagnosis not present

## 2022-07-12 ENCOUNTER — Other Ambulatory Visit (HOSPITAL_COMMUNITY): Payer: Self-pay

## 2022-07-12 MED ORDER — FLUOXETINE HCL 40 MG PO CAPS
40.0000 mg | ORAL_CAPSULE | Freq: Every day | ORAL | 4 refills | Status: AC
  Filled 2022-09-29 – 2022-11-14 (×4): qty 90, 90d supply, fill #0
  Filled 2023-03-13: qty 90, 90d supply, fill #1
  Filled 2023-06-08: qty 90, 90d supply, fill #2

## 2022-07-12 MED ORDER — ALPRAZOLAM 0.5 MG PO TABS
0.5000 mg | ORAL_TABLET | Freq: Every day | ORAL | 0 refills | Status: DC | PRN
  Filled 2022-07-12 – 2022-07-18 (×2): qty 90, 90d supply, fill #0

## 2022-07-12 MED ORDER — FLUOXETINE HCL 20 MG PO CAPS
20.0000 mg | ORAL_CAPSULE | Freq: Every day | ORAL | 4 refills | Status: DC
  Filled 2022-07-12 – 2022-07-18 (×2): qty 90, 90d supply, fill #0

## 2022-07-18 ENCOUNTER — Other Ambulatory Visit (HOSPITAL_COMMUNITY): Payer: Self-pay

## 2022-07-18 ENCOUNTER — Other Ambulatory Visit (HOSPITAL_BASED_OUTPATIENT_CLINIC_OR_DEPARTMENT_OTHER): Payer: Self-pay

## 2022-07-21 DIAGNOSIS — H5213 Myopia, bilateral: Secondary | ICD-10-CM | POA: Diagnosis not present

## 2022-07-31 DIAGNOSIS — F4323 Adjustment disorder with mixed anxiety and depressed mood: Secondary | ICD-10-CM | POA: Diagnosis not present

## 2022-08-18 DIAGNOSIS — D485 Neoplasm of uncertain behavior of skin: Secondary | ICD-10-CM | POA: Diagnosis not present

## 2022-08-18 DIAGNOSIS — D2239 Melanocytic nevi of other parts of face: Secondary | ICD-10-CM | POA: Diagnosis not present

## 2022-08-18 DIAGNOSIS — L732 Hidradenitis suppurativa: Secondary | ICD-10-CM | POA: Diagnosis not present

## 2022-08-18 DIAGNOSIS — D225 Melanocytic nevi of trunk: Secondary | ICD-10-CM | POA: Diagnosis not present

## 2022-08-18 DIAGNOSIS — L814 Other melanin hyperpigmentation: Secondary | ICD-10-CM | POA: Diagnosis not present

## 2022-08-18 DIAGNOSIS — L821 Other seborrheic keratosis: Secondary | ICD-10-CM | POA: Diagnosis not present

## 2022-08-23 ENCOUNTER — Encounter: Payer: 59 | Admitting: Family Medicine

## 2022-08-28 ENCOUNTER — Other Ambulatory Visit (HOSPITAL_BASED_OUTPATIENT_CLINIC_OR_DEPARTMENT_OTHER): Payer: Self-pay

## 2022-08-28 ENCOUNTER — Other Ambulatory Visit: Payer: Self-pay

## 2022-08-28 ENCOUNTER — Encounter: Payer: Self-pay | Admitting: Family Medicine

## 2022-08-28 ENCOUNTER — Ambulatory Visit (INDEPENDENT_AMBULATORY_CARE_PROVIDER_SITE_OTHER): Payer: 59 | Admitting: Family Medicine

## 2022-08-28 VITALS — BP 116/72 | HR 73 | Temp 97.8°F | Ht 65.0 in | Wt 251.6 lb

## 2022-08-28 DIAGNOSIS — E039 Hypothyroidism, unspecified: Secondary | ICD-10-CM | POA: Diagnosis not present

## 2022-08-28 DIAGNOSIS — E559 Vitamin D deficiency, unspecified: Secondary | ICD-10-CM | POA: Diagnosis not present

## 2022-08-28 DIAGNOSIS — Z0000 Encounter for general adult medical examination without abnormal findings: Secondary | ICD-10-CM

## 2022-08-28 MED ORDER — LEVOTHYROXINE SODIUM 75 MCG PO TABS
75.0000 ug | ORAL_TABLET | ORAL | 1 refills | Status: AC
Start: 2022-08-28 — End: ?
  Filled 2022-08-28: qty 90, 210d supply, fill #0

## 2022-08-28 MED ORDER — LEVOTHYROXINE SODIUM 100 MCG PO TABS
ORAL_TABLET | ORAL | 1 refills | Status: DC
Start: 2022-08-28 — End: 2023-01-02
  Filled 2022-08-28: qty 48, 84d supply, fill #0
  Filled 2022-09-29 – 2022-11-01 (×3): qty 48, 84d supply, fill #1
  Filled 2022-11-01 – 2022-11-03 (×3): qty 48, 84d supply, fill #0
  Filled 2022-12-28: qty 30, 54d supply, fill #1

## 2022-08-28 NOTE — Progress Notes (Signed)
Complete physical exam  Patient: Hannah Douglas   DOB: [birth year]   37 y.o. Female  MRN: [id number]  Subjective:    Chief Complaint  Patient presents with   Annual Exam    KHRISTI WAGLEY is a 37 y.o. female who presents today for a complete physical exam. She reports consuming a general diet. Home exercise routine includes walking 2 hrs per week. She generally feels well. She reports sleeping well. She does not have additional problems to discuss today.    Most recent fall risk assessment:    08/05/2021    9:38 AM  Fall Risk   Falls in the past year? 0  Number falls in past yr: 0  Injury with Fall? 0  Risk for fall due to : No Fall Risks  Follow up Falls evaluation completed     Most recent depression screenings:    08/28/2022    9:08 AM 05/25/2022    4:55 PM  PHQ 2/9 Scores  PHQ - 2 Score 0 1  PHQ- 9 Score 5 8    Vision:Within last year and Dental: No current dental problems and Receives regular dental care  Patient Active Problem List   Diagnosis Date Noted   Depression     Priority: Medium    Hidradenitis suppurativa 08/21/2019   Hypothyroidism 12/03/2013   Obesity, unspecified 12/03/2013      Patient Care Team: Karie Georges, MD as PCP - General (Family Medicine) Carrington Clamp, MD as Consulting Physician (Obstetrics and Gynecology)   Outpatient Medications Prior to Visit  Medication Sig   ALPRAZolam Prudy Feeler) 0.5 MG tablet Take 1 tablet (0.5 mg total) by mouth daily as needed.   ALPRAZolam (XANAX) 0.5 MG tablet Take 1 tablet (0.5 mg total) by mouth daily as needed.   FLUoxetine (PROZAC) 20 MG capsule Take 1 capsule (20 mg total) by mouth daily with 40 mg capsule to equal 60 mg total.   FLUoxetine (PROZAC) 20 MG capsule Take 1 capsule (20 mg total) by mouth daily.   FLUoxetine (PROZAC) 40 MG capsule Take 1 capsule (40 mg total) by mouth daily. Take with 20 mg capsule to equal 60 mg total daily.   FLUoxetine (PROZAC) 40 MG capsule Take 1 capsule  (40 mg total) by mouth daily.   Multiple Vitamin (MULTIVITAMIN) tablet Take 1 tablet by mouth daily.   Omega-3 Fatty Acids (FISH OIL) 1000 MG CAPS Take by mouth 2 (two) times a week.   tretinoin (RETIN-A) 0.05 % cream Apply 1 application to affected areas  topically daily as needed.   [DISCONTINUED] levothyroxine (SYNTHROID) 100 MCG tablet Take 1 tablet by mouth  4 days per week. Alternate with levothyroxine .   [DISCONTINUED] levothyroxine (SYNTHROID) 75 MCG tablet Take 1 tablet (75 mcg total) by mouth 3 (three) times a week. Alternate with levothyroxine .   [DISCONTINUED] spironolactone (ALDACTONE) 50 MG tablet Take 1 tablet (50 mg total) by mouth 2 (two) times daily.   [DISCONTINUED] ibuprofen (ADVIL) 800 MG tablet Take 1 tablet (800 mg total) by mouth every 8 (eight) hours as needed.   No facility-administered medications prior to visit.    Review of Systems  HENT:  Negative for hearing loss.   Eyes:  Negative for blurred vision.  Respiratory:  Negative for shortness of breath.   Cardiovascular:  Negative for chest pain.  Gastrointestinal: Negative.   Genitourinary: Negative.   Musculoskeletal:  Negative for back pain.  Neurological:  Negative for headaches.  Psychiatric/Behavioral:  Negative for  depression.   All other systems reviewed and are negative.      Objective:     BP 116/72 (BP Location: Right Arm, Patient Position: Sitting, Cuff Size: Large)   Pulse 73   Temp 97.8 F (36.6 C) (Oral)   Ht 5\' 5"  (1.651 m)   Wt 251 lb 9.6 oz (114.1 kg)   LMP 08/12/2022 (Exact Date)   SpO2 98%   BMI 41.87 kg/m    Physical Exam Vitals reviewed.  Constitutional:      Appearance: Normal appearance. She is well-groomed and normal weight.  HENT:     Right Ear: Tympanic membrane normal.     Left Ear: Tympanic membrane normal.     Mouth/Throat:     Mouth: Mucous membranes are moist.     Pharynx: No posterior oropharyngeal erythema.  Eyes:     Conjunctiva/sclera:  Conjunctivae normal.  Neck:     Thyroid: No thyromegaly.  Cardiovascular:     Rate and Rhythm: Normal rate and regular rhythm.     Pulses: Normal pulses.     Heart sounds: S1 normal and S2 normal.  Pulmonary:     Effort: Pulmonary effort is normal.     Breath sounds: Normal breath sounds and air entry.  Abdominal:     General: Bowel sounds are normal.  Musculoskeletal:     Right lower leg: No edema.     Left lower leg: No edema.  Lymphadenopathy:     Cervical: No cervical adenopathy.  Neurological:     Mental Status: She is alert and oriented to person, place, and time. Mental status is at baseline.     Gait: Gait is intact.  Psychiatric:        Mood and Affect: Mood and affect normal.        Speech: Speech normal.        Behavior: Behavior normal.        Judgment: Judgment normal.      No results found for any visits on 08/28/22.     Assessment & Plan:    Routine Health Maintenance and Physical Exam  Immunization History  Administered Date(s) Administered   Influenza,inj,Quad PF,6+ Mos 11/26/2015   Influenza-Unspecified 12/25/2013, 11/25/2016, 01/09/2020, 12/09/2020   Moderna Sars-Covid-2 Vaccination 05/31/2019, 07/02/2019, 02/25/2020   Tdap 08/08/2012, 06/01/2016    Health Maintenance  Topic Date Due   COVID-19 Vaccine (4 - 2023-24 season) 08/28/2023 (Originally 11/25/2021)   INFLUENZA VACCINE  10/26/2022   DTaP/Tdap/Td (3 - Td or Tdap) 06/02/2026   PAP SMEAR-Modifier  08/06/2026   Hepatitis C Screening  Completed   HIV Screening  Completed   HPV VACCINES  Aged Out    Discussed health benefits of physical activity, and encouraged her to engage in regular exercise appropriate for her age and condition.  Hypothyroidism, unspecified type -     Levothyroxine Sodium; Take 1 tablet by mouth 4 days per week. Alternate with levothyroxine .  Dispense: 90 tablet; Refill: 1 -     Levothyroxine Sodium; Take 1 tablet (75 mcg total) by mouth 3 (three) times a week.  Alternate with levothyroxine .  Dispense: 90 tablet; Refill: 1 -     TSH; Future  Preventative health care -     Comprehensive metabolic panel; Future -     Lipid panel; Future  Vitamin D deficiency -     VITAMIN D 25 Hydroxy (Vit-D Deficiency, Fractures); Future  Routine general medical examination at a health care facility  Normal physical exam findings,  will check her annual labs -- pt has hx vitamin D deficiency and has not had levels checked in some time. Handouts given on healthy eating and exercise.   Return in 1 year (on 08/28/2023).     Karie Georges, MD

## 2022-08-28 NOTE — Patient Instructions (Signed)

## 2022-08-29 ENCOUNTER — Other Ambulatory Visit (INDEPENDENT_AMBULATORY_CARE_PROVIDER_SITE_OTHER): Payer: 59

## 2022-08-29 DIAGNOSIS — E559 Vitamin D deficiency, unspecified: Secondary | ICD-10-CM | POA: Diagnosis not present

## 2022-08-29 DIAGNOSIS — Z0000 Encounter for general adult medical examination without abnormal findings: Secondary | ICD-10-CM

## 2022-08-29 DIAGNOSIS — E039 Hypothyroidism, unspecified: Secondary | ICD-10-CM | POA: Diagnosis not present

## 2022-08-29 LAB — COMPREHENSIVE METABOLIC PANEL
ALT: 33 U/L (ref 0–35)
AST: 20 U/L (ref 0–37)
Albumin: 4.4 g/dL (ref 3.5–5.2)
Alkaline Phosphatase: 56 U/L (ref 39–117)
BUN: 12 mg/dL (ref 6–23)
CO2: 24 mEq/L (ref 19–32)
Calcium: 9.1 mg/dL (ref 8.4–10.5)
Chloride: 104 mEq/L (ref 96–112)
Creatinine, Ser: 0.57 mg/dL (ref 0.40–1.20)
GFR: 115.17 mL/min (ref >60.00)
Glucose, Bld: 95 mg/dL (ref 70–99)
Potassium: 4.1 mEq/L (ref 3.5–5.1)
Sodium: 136 mEq/L (ref 135–145)
Total Bilirubin: 0.3 mg/dL (ref 0.2–1.2)
Total Protein: 7.2 g/dL (ref 6.0–8.3)

## 2022-08-29 LAB — LIPID PANEL
Cholesterol: 168 mg/dL (ref 0–200)
HDL: 36.9 mg/dL — ABNORMAL LOW (ref >39.00)
NonHDL: 131.03
Total CHOL/HDL Ratio: 5
Triglycerides: 206 mg/dL — ABNORMAL HIGH (ref 0.0–149.0)
VLDL: 41.2 mg/dL — ABNORMAL HIGH (ref 0.0–40.0)

## 2022-08-29 LAB — VITAMIN D 25 HYDROXY (VIT D DEFICIENCY, FRACTURES): VITD: 29.57 ng/mL — ABNORMAL LOW (ref 30.00–100.00)

## 2022-08-29 LAB — TSH: TSH: 3.96 u[IU]/mL (ref 0.35–5.50)

## 2022-08-29 LAB — LDL CHOLESTEROL, DIRECT: Direct LDL: 110 mg/dL

## 2022-08-30 ENCOUNTER — Encounter: Payer: Self-pay | Admitting: Family Medicine

## 2022-08-30 DIAGNOSIS — E039 Hypothyroidism, unspecified: Secondary | ICD-10-CM

## 2022-09-02 ENCOUNTER — Other Ambulatory Visit (HOSPITAL_BASED_OUTPATIENT_CLINIC_OR_DEPARTMENT_OTHER): Payer: Self-pay

## 2022-09-15 ENCOUNTER — Emergency Department (HOSPITAL_BASED_OUTPATIENT_CLINIC_OR_DEPARTMENT_OTHER): Payer: 59

## 2022-09-15 ENCOUNTER — Encounter (HOSPITAL_BASED_OUTPATIENT_CLINIC_OR_DEPARTMENT_OTHER): Payer: Self-pay | Admitting: Emergency Medicine

## 2022-09-15 ENCOUNTER — Other Ambulatory Visit: Payer: Self-pay

## 2022-09-15 ENCOUNTER — Observation Stay (HOSPITAL_BASED_OUTPATIENT_CLINIC_OR_DEPARTMENT_OTHER)
Admission: EM | Admit: 2022-09-15 | Discharge: 2022-09-18 | Disposition: A | Discharge status: Home / Self Care | Payer: 59 | Attending: Emergency Medicine | Admitting: Emergency Medicine

## 2022-09-15 DIAGNOSIS — Z6839 Body mass index (BMI) 39.0-39.9, adult: Secondary | ICD-10-CM

## 2022-09-15 DIAGNOSIS — N281 Cyst of kidney, acquired: Secondary | ICD-10-CM | POA: Diagnosis not present

## 2022-09-15 DIAGNOSIS — K37 Unspecified appendicitis: Principal | ICD-10-CM | POA: Diagnosis present

## 2022-09-15 DIAGNOSIS — Z801 Family history of malignant neoplasm of trachea, bronchus and lung: Secondary | ICD-10-CM

## 2022-09-15 DIAGNOSIS — K3580 Unspecified acute appendicitis: Principal | ICD-10-CM | POA: Insufficient documentation

## 2022-09-15 DIAGNOSIS — F32A Depression, unspecified: Secondary | ICD-10-CM | POA: Diagnosis present

## 2022-09-15 DIAGNOSIS — E039 Hypothyroidism, unspecified: Secondary | ICD-10-CM | POA: Diagnosis not present

## 2022-09-15 DIAGNOSIS — Z803 Family history of malignant neoplasm of breast: Secondary | ICD-10-CM

## 2022-09-15 DIAGNOSIS — Z809 Family history of malignant neoplasm, unspecified: Secondary | ICD-10-CM

## 2022-09-15 DIAGNOSIS — Z79899 Other long term (current) drug therapy: Secondary | ICD-10-CM | POA: Insufficient documentation

## 2022-09-15 DIAGNOSIS — R1013 Epigastric pain: Secondary | ICD-10-CM | POA: Diagnosis present

## 2022-09-15 DIAGNOSIS — Z79890 Hormone replacement therapy (postmenopausal): Secondary | ICD-10-CM

## 2022-09-15 DIAGNOSIS — E669 Obesity, unspecified: Secondary | ICD-10-CM | POA: Diagnosis present

## 2022-09-15 DIAGNOSIS — K760 Fatty (change of) liver, not elsewhere classified: Secondary | ICD-10-CM | POA: Diagnosis not present

## 2022-09-15 LAB — CBC
HCT: 37.9 % (ref 36.0–46.0)
Hemoglobin: 13.7 g/dL (ref 12.0–15.0)
MCH: 31.3 pg (ref 26.0–34.0)
MCHC: 36.1 g/dL — ABNORMAL HIGH (ref 30.0–36.0)
MCV: 86.5 fL (ref 80.0–100.0)
Platelets: 312 10*3/uL (ref 150–400)
RBC: 4.38 MIL/uL (ref 3.87–5.11)
RDW: 12.1 % (ref 11.5–15.5)
WBC: 9.6 10*3/uL (ref 4.0–10.5)
nRBC: 0 % (ref 0.0–0.2)

## 2022-09-15 LAB — COMPREHENSIVE METABOLIC PANEL
ALT: 35 U/L (ref 0–44)
AST: 21 U/L (ref 15–41)
Albumin: 4.7 g/dL (ref 3.5–5.0)
Alkaline Phosphatase: 64 U/L (ref 38–126)
Anion gap: 12 (ref 5–15)
BUN: 6 mg/dL (ref 6–20)
CO2: 26 mmol/L (ref 22–32)
Calcium: 9.9 mg/dL (ref 8.9–10.3)
Chloride: 97 mmol/L — ABNORMAL LOW (ref 98–111)
Creatinine, Ser: 0.69 mg/dL (ref 0.44–1.00)
GFR, Estimated: >60 mL/min (ref >60)
Glucose, Bld: 93 mg/dL (ref 70–99)
Potassium: 3.9 mmol/L (ref 3.5–5.1)
Sodium: 135 mmol/L (ref 135–145)
Total Bilirubin: 0.8 mg/dL (ref 0.3–1.2)
Total Protein: 7.9 g/dL (ref 6.5–8.1)

## 2022-09-15 LAB — LIPASE, BLOOD: Lipase: 53 U/L — ABNORMAL HIGH (ref 11–51)

## 2022-09-15 MED ORDER — FAMOTIDINE IN NACL 20-0.9 MG/50ML-% IV SOLN
20.0000 mg | Freq: Once | INTRAVENOUS | Status: AC
  Administered 2022-09-15: 20 mg via INTRAVENOUS
  Filled 2022-09-15: qty 50

## 2022-09-15 MED ORDER — ALUM & MAG HYDROXIDE-SIMETH 200-200-20 MG/5ML PO SUSP
30.0000 mL | Freq: Once | ORAL | Status: AC
  Administered 2022-09-15: 30 mL via ORAL
  Filled 2022-09-15: qty 30

## 2022-09-15 MED ORDER — METRONIDAZOLE 500 MG/100ML IV SOLN
500.0000 mg | Freq: Once | INTRAVENOUS | Status: AC
  Administered 2022-09-16: 500 mg via INTRAVENOUS
  Filled 2022-09-15: qty 100

## 2022-09-15 MED ORDER — LIDOCAINE VISCOUS HCL 2 % MT SOLN
15.0000 mL | Freq: Once | OROMUCOSAL | Status: AC
  Administered 2022-09-15: 15 mL via ORAL
  Filled 2022-09-15: qty 15

## 2022-09-15 MED ORDER — IOHEXOL 300 MG/ML  SOLN
100.0000 mL | Freq: Once | INTRAMUSCULAR | Status: AC | PRN
  Administered 2022-09-15: 100 mL via INTRAVENOUS

## 2022-09-15 MED ORDER — SODIUM CHLORIDE 0.9 % IV BOLUS
1000.0000 mL | Freq: Once | INTRAVENOUS | Status: AC
  Administered 2022-09-15: 1000 mL via INTRAVENOUS

## 2022-09-15 MED ORDER — SODIUM CHLORIDE 0.9 % IV SOLN
2.0000 g | Freq: Once | INTRAVENOUS | Status: AC
  Administered 2022-09-16: 2 g via INTRAVENOUS
  Filled 2022-09-15: qty 20

## 2022-09-15 NOTE — ED Provider Notes (Incomplete)
[place] EMERGENCY DEPARTMENT AT [facility] Provider Note   CSN: [id number] Arrival date & time: 09/15/22  1827     History {Add pertinent medical, surgical, social history, OB history to HPI:1} Chief Complaint  Patient presents with  . Abdominal Pain    Hannah Douglas is a 37 y.o. female.  Presenting to the ED for evaluation of abdominal pain.  States this pain began at approximately 6:30 PM last night shortly after eating dinner.  It is in the epigastric region.  It is intermittent but worse with movements and eating.  It is described as an aching pain with some intermittent sharp pains.  It is alleviated by resting on her bed.  It occasionally radiates to encompass the entire abdomen but is typically only localized to the epigastric region.  She denies any nausea, vomiting, diarrhea, fevers, chills.  Also denies lower abdominal pain, dysuria, frequency, urgency, back pain, vaginal pain, itching, odor, discharge.  She had a normal bowel movement this morning.   Abdominal Pain      Home Medications Prior to Admission medications   Medication Sig Start Date End Date Taking? Authorizing Provider  ALPRAZolam Prudy Feeler) 0.5 MG tablet Take 1 tablet (0.5 mg total) by mouth daily as needed. 07/21/21     ALPRAZolam (XANAX) 0.5 MG tablet Take 1 tablet (0.5 mg total) by mouth daily as needed. 07/12/22     FLUoxetine (PROZAC) 20 MG capsule Take 1 capsule (20 mg total) by mouth daily with 40 mg capsule to equal 60 mg total. 07/21/21     FLUoxetine (PROZAC) 20 MG capsule Take 1 capsule (20 mg total) by mouth daily. 07/12/22     FLUoxetine (PROZAC) 40 MG capsule Take 1 capsule (40 mg total) by mouth daily. Take with 20 mg capsule to equal 60 mg total daily. 07/21/21     FLUoxetine (PROZAC) 40 MG capsule Take 1 capsule (40 mg total) by mouth daily. 07/12/22     levothyroxine (SYNTHROID) 100 MCG tablet Take 1 tablet by mouth 4 days per week. Alternate with levothyroxine . 08/28/22   Karie Georges, MD  levothyroxine (SYNTHROID) 75 MCG tablet Take 1 tablet (75 mcg total) by mouth 3 (three) times a week. Alternate with levothyroxine . 08/28/22   Karie Georges, MD  Multiple Vitamin (MULTIVITAMIN) tablet Take 1 tablet by mouth daily.    [provider]  Omega-3 Fatty Acids (FISH OIL) 1000 MG CAPS Take by mouth 2 (two) times a week.    [provider]  tretinoin (RETIN-A) 0.05 % cream Apply 1 application to affected areas  topically daily as needed. 05/03/22         Allergies    Patient has no known allergies.    Review of Systems   Review of Systems  Gastrointestinal:  Positive for abdominal pain.  All other systems reviewed and are negative.   Physical Exam Updated Vital Signs BP 128/80   Pulse 72   Temp 98 F (36.7 C)   Resp 18   Ht 5\' 5"  (1.651 m)   Wt 108.9 kg   LMP 08/12/2022 (Exact Date)   SpO2 99%   BMI 39.94 kg/m  Physical Exam Vitals and nursing note reviewed.  Constitutional:      General: She is not in acute distress.    Appearance: She is well-developed.     Comments: Resting comfortably in bed  HENT:     Head: Normocephalic and atraumatic.  Eyes:     Conjunctiva/sclera:  Conjunctivae normal.  Cardiovascular:     Rate and Rhythm: Normal rate and regular rhythm.     Heart sounds: No murmur heard. Pulmonary:     Effort: Pulmonary effort is normal. No respiratory distress.     Breath sounds: Normal breath sounds.  Abdominal:     Palpations: Abdomen is soft.     Tenderness: There is abdominal tenderness in the right lower quadrant and epigastric area. There is no right CVA tenderness or left CVA tenderness. Positive signs include McBurney's sign. Negative signs include Murphy's sign, Rovsing's sign, psoas sign and obturator sign.  Musculoskeletal:        General: No swelling.     Cervical back: Neck supple.  Skin:    General: Skin is warm and dry.     Capillary Refill: Capillary refill takes less than 2 seconds.   Neurological:     Mental Status: She is alert.  Psychiatric:        Mood and Affect: Mood normal.     ED Results / Procedures / Treatments   Labs (all labs ordered are listed, but only abnormal results are displayed) Labs Reviewed  LIPASE, BLOOD - Abnormal; Notable for the following components:      Result Value   Lipase 53 (*)    All other components within normal limits  COMPREHENSIVE METABOLIC PANEL - Abnormal; Notable for the following components:   Chloride 97 (*)    All other components within normal limits  CBC - Abnormal; Notable for the following components:   MCHC 36.1 (*)    All other components within normal limits  URINALYSIS, ROUTINE W REFLEX MICROSCOPIC  PREGNANCY, URINE    EKG None  Radiology CT ABDOMEN PELVIS W CONTRAST  Result Date: 09/15/2022 CLINICAL DATA:  Abdominal pain. EXAM: CT ABDOMEN AND PELVIS WITH CONTRAST TECHNIQUE: Multidetector CT imaging of the abdomen and pelvis was performed using the standard protocol following bolus administration of intravenous contrast. RADIATION DOSE REDUCTION: This exam was performed according to the departmental dose-optimization program which includes automated exposure control, adjustment of the mA and/or kV according to patient size and/or use of iterative reconstruction technique. CONTRAST:  OMNIPAQUE IOHEXOL 300 MG/ML  SOLN COMPARISON:  None Available. FINDINGS: Lower chest: Mild linear atelectasis is seen within the right lung base. Hepatobiliary: There is diffuse fatty infiltration of the liver parenchyma. No focal liver abnormality is seen. No gallstones, gallbladder wall thickening, or biliary dilatation. Pancreas: Unremarkable. No pancreatic ductal dilatation or surrounding inflammatory changes. Spleen: Normal in size without focal abnormality. Adrenals/Urinary Tract: Adrenal glands are unremarkable. Kidneys are normal in size, without renal calculi or hydronephrosis. Bilateral subcentimeter simple renal cysts  are seen. Bladder is unremarkable. Stomach/Bowel: Stomach is within normal limits. A trace amount of inflammatory fat stranding is seen along the proximal portion of a 5.5 mm diameter, fluid-filled appendix. No appendiceal wall thickening is noted. No evidence of bowel wall thickening, distention, or inflammatory changes. Vascular/Lymphatic: No significant vascular findings are present. No enlarged abdominal or pelvic lymph nodes. Reproductive: Uterus and bilateral adnexa are unremarkable. Other: No abdominal wall hernia or abnormality. No abdominopelvic ascites. Musculoskeletal: No acute or significant osseous findings. IMPRESSION: 1. Findings which may represent very early changes of acute appendicitis. Correlation with physical examination is recommended. 2. Hepatic steatosis. Electronically Signed   By: Aram Candela M.D.   On: 09/15/2022 23:45    Procedures Procedures  {Document cardiac monitor, telemetry assessment procedure when appropriate:1}  Medications Ordered in ED Medications  cefTRIAXone (ROCEPHIN)  2 g in sodium chloride 0.9 % 100 mL IVPB (has no administration in time range)  metroNIDAZOLE (FLAGYL) IVPB 500 mg (has no administration in time range)  morphine (PF) 4 MG/ML injection 4 mg (has no administration in time range)  sodium chloride 0.9 % bolus 1,000 mL (0 mLs Intravenous Stopped 09/15/22 2340)  alum & mag hydroxide-simeth (MAALOX/MYLANTA) 200-200-20 MG/5ML suspension 30 mL (30 mLs Oral Given 09/15/22 2118)    And  lidocaine (XYLOCAINE) 2 % viscous mouth solution 15 mL (15 mLs Oral Given 09/15/22 2117)  famotidine (PEPCID) IVPB 20 mg premix (0 mg Intravenous Stopped 09/15/22 2131)  iohexol (OMNIPAQUE) 300 MG/ML solution 100 mL (100 mLs Intravenous Contrast Given 09/15/22 2320)    ED Course/ Medical Decision Making/ A&P Clinical Course as of 09/16/22 0004  Fri Sep 15, 2022  2357 Spoke with Dr. Sheliah Hatch.  He recommends admission to [facility].  Ceftriaxone and Flagyl  antibiotics. [AS]    Clinical Course User Index [AS] Jashanti Clinkscale, Edsel Petrin, PA-C   {   Click here for ABCD2, HEART and other calculatorsREFRESH Note before signing :1}                          Medical Decision Making Amount and/or Complexity of Data Reviewed Labs: ordered. Radiology: ordered.  Risk OTC drugs. Prescription drug management.  This patient presents to the ED for concern of abdominal pain, this involves an extensive number of treatment options, and is a complaint that carries with it a high risk of complications and morbidity. Differential diagnosis of epigastric pain includes: Functional or nonulcer dyspepsia, PUD, GERD, Gastritis, (NSAIDs, alcohol, stress, H. pylori, pernicious anemia), pancreatitis or pancreatic cancer, overeating indigestion (high-fat foods, coffee), drugs (aspirin, antibiotics (eg, macrolides, metronidazole), corticosteroids, digoxin, narcotics, theophylline), gastroparesis, lactose intolerance, malabsorption gastric cancer, parasitic infection, (Giardia, Strongyloides, Ascaris) cholelithiasis, choledocholithiasis, or cholangitis, ACS, pericarditis, pneumonia, abdominal hernia, pregnancy, intestinal ischemia, esophageal rupture, gastric volvulus, hepatitis.   Co morbidities that complicate the patient evaluation  . anxiety, depression, hypothyroidism  My initial workup includes abdominal pain labs, symptom control  Additional history obtained from: Nursing notes from this visit.  I ordered, reviewed and interpreted labs which include: CBC, CMP, lipase, urinalysis, urine pregnancy.  Lipase minimally elevated to 53  I ordered imaging studies including CT abdomen pelvis I independently visualized and interpreted imaging which showed normal I agree with the radiologist interpretation  Afebrile, hemodynamically stable. 37 year old female presenting to the ED for evaluation of epigastric abdominal pain. This is worse with eating and movement. She appears  very well on physical exam. She has mild epigastric and RUQ TTP with a negative murphy sign. Her lab workup was reassuring. Lipase was borderline elevated. She was treated in the ED with fluids and a GI cocktail. She reported improvement in her symptoms after this.  Her CT scan showed some possible early changes concerning for appendicitis.  She does have some right lower quadrant abdominal TTP on exam.  Spoke with Dr. Sheliah Hatch with general surgery.  He recommends IV antibiotics, admission to [facility].  Patient is in agreement with this plan.  Temporary admission orders were placed.  Stable at the time of admission.  Note: Portions of this report may have been transcribed using voice recognition software. Every effort was made to ensure accuracy; however, inadvertent computerized transcription errors may still be present.  {Document critical care time when appropriate:1} {Document review of labs and clinical decision tools ie heart score, Chads2Vasc2  etc:1}  {Document your independent review of radiology images, and any outside records:1} {Document your discussion with family members, caretakers, and with consultants:1} {Document social determinants of health affecting pt's care:1} {Document your decision making why or why not admission, treatments were needed:1} Final Clinical Impression(s) / ED Diagnoses Final diagnoses:  Epigastric abdominal pain    Rx / DC Orders ED Discharge Orders     None

## 2022-09-15 NOTE — Discharge Instructions (Addendum)
You have been seen today for your complaint of abdominal pain. Your lab work  was reassuring. Follow up with: Your primary care provider within the next week for reevaluation. Please seek immediate medical care if you develop any of the following symptoms: You vomit blood or a substance that looks like coffee grounds. You have black or dark red stools. You are unable to keep fluids down. At this time there does not appear to be the presence of an emergent medical condition, however there is always the potential for conditions to change. Please read and follow the below instructions.  Do not take your medicine if  develop an itchy rash, swelling in your mouth or lips, or difficulty breathing; call 911 and seek immediate emergency medical attention if this occurs.  You may review your lab tests and imaging results in their entirety on your MyChart account.  Please discuss all results of fully with your primary care provider and other specialist at your follow-up visit.  Note: Portions of this text may have been transcribed using voice recognition software. Every effort was made to ensure accuracy; however, inadvertent computerized transcription errors may still be present.

## 2022-09-15 NOTE — ED Triage Notes (Addendum)
Pt arrives to ED with c/o epigastric abdominal pain that started last night.

## 2022-09-15 NOTE — ED Provider Notes (Signed)
[place] EMERGENCY DEPARTMENT AT [facility] Provider Note   CSN: [id number] Arrival date & time: 09/15/22  1827     History {Add pertinent medical, surgical, social history, OB history to HPI:1} Chief Complaint  Patient presents with   Abdominal Pain    Hannah Douglas is a 37 y.o. female.  Presenting to the ED for evaluation of abdominal pain.  States this pain began at approximately 6:30 PM last night shortly after eating dinner.  It is in the epigastric region.  It is intermittent but worse with movements and eating.  It is described as an aching pain with some intermittent sharp pains.  It is alleviated by resting on her bed.  It occasionally radiates to encompass the entire abdomen but is typically only localized to the epigastric region.  She denies any nausea, vomiting, diarrhea, fevers, chills.  Also denies lower abdominal pain, dysuria, frequency, urgency, back pain, vaginal pain, itching, odor, discharge.  She had a normal bowel movement this morning.   Abdominal Pain      Home Medications Prior to Admission medications   Medication Sig Start Date End Date Taking? Authorizing Provider  ALPRAZolam Prudy Feeler) 0.5 MG tablet Take 1 tablet (0.5 mg total) by mouth daily as needed. 07/21/21     ALPRAZolam (XANAX) 0.5 MG tablet Take 1 tablet (0.5 mg total) by mouth daily as needed. 07/12/22     FLUoxetine (PROZAC) 20 MG capsule Take 1 capsule (20 mg total) by mouth daily with 40 mg capsule to equal 60 mg total. 07/21/21     FLUoxetine (PROZAC) 20 MG capsule Take 1 capsule (20 mg total) by mouth daily. 07/12/22     FLUoxetine (PROZAC) 40 MG capsule Take 1 capsule (40 mg total) by mouth daily. Take with 20 mg capsule to equal 60 mg total daily. 07/21/21     FLUoxetine (PROZAC) 40 MG capsule Take 1 capsule (40 mg total) by mouth daily. 07/12/22     levothyroxine (SYNTHROID) 100 MCG tablet Take 1 tablet by mouth 4 days per week. Alternate with levothyroxine . 08/28/22   Karie Georges, MD  levothyroxine (SYNTHROID) 75 MCG tablet Take 1 tablet (75 mcg total) by mouth 3 (three) times a week. Alternate with levothyroxine . 08/28/22   Karie Georges, MD  Multiple Vitamin (MULTIVITAMIN) tablet Take 1 tablet by mouth daily.    [provider]  Omega-3 Fatty Acids (FISH OIL) 1000 MG CAPS Take by mouth 2 (two) times a week.    [provider]  tretinoin (RETIN-A) 0.05 % cream Apply 1 application to affected areas  topically daily as needed. 05/03/22         Allergies    Patient has no known allergies.    Review of Systems   Review of Systems  Gastrointestinal:  Positive for abdominal pain.  All other systems reviewed and are negative.   Physical Exam Updated Vital Signs BP (!) 131/91 (BP Location: Right Arm)   Pulse 92   Temp 97.8 F (36.6 C) (Temporal)   Resp 15   Ht 5\' 5"  (1.651 m)   Wt 108.9 kg   LMP 08/12/2022 (Exact Date)   SpO2 99%   BMI 39.94 kg/m  Physical Exam Vitals and nursing note reviewed.  Constitutional:      General: She is not in acute distress.    Appearance: She is well-developed.     Comments: Resting comfortably in bed  HENT:     Head: Normocephalic and atraumatic.  Eyes:     Conjunctiva/sclera: Conjunctivae normal.  Cardiovascular:     Rate and Rhythm: Normal rate and regular rhythm.     Heart sounds: No murmur heard. Pulmonary:     Effort: Pulmonary effort is normal. No respiratory distress.     Breath sounds: Normal breath sounds.  Abdominal:     Palpations: Abdomen is soft.     Tenderness: There is abdominal tenderness in the epigastric area. There is no right CVA tenderness or left CVA tenderness. Negative signs include Murphy's sign and McBurney's sign.  Musculoskeletal:        General: No swelling.     Cervical back: Neck supple.  Skin:    General: Skin is warm and dry.     Capillary Refill: Capillary refill takes less than 2 seconds.  Neurological:     Mental Status: She is alert.   Psychiatric:        Mood and Affect: Mood normal.     ED Results / Procedures / Treatments   Labs (all labs ordered are listed, but only abnormal results are displayed) Labs Reviewed  LIPASE, BLOOD - Abnormal; Notable for the following components:      Result Value   Lipase 53 (*)    All other components within normal limits  COMPREHENSIVE METABOLIC PANEL - Abnormal; Notable for the following components:   Chloride 97 (*)    All other components within normal limits  CBC - Abnormal; Notable for the following components:   MCHC 36.1 (*)    All other components within normal limits  URINALYSIS, ROUTINE W REFLEX MICROSCOPIC  PREGNANCY, URINE    EKG None  Radiology No results found.  Procedures Procedures  {Document cardiac monitor, telemetry assessment procedure when appropriate:1}  Medications Ordered in ED Medications  sodium chloride 0.9 % bolus 1,000 mL (has no administration in time range)  alum & mag hydroxide-simeth (MAALOX/MYLANTA) 200-200-20 MG/5ML suspension 30 mL (has no administration in time range)    And  lidocaine (XYLOCAINE) 2 % viscous mouth solution 15 mL (has no administration in time range)  famotidine (PEPCID) IVPB 20 mg premix (has no administration in time range)    ED Course/ Medical Decision Making/ A&P   {   Click here for ABCD2, HEART and other calculatorsREFRESH Note before signing :1}                          Medical Decision Making Amount and/or Complexity of Data Reviewed Labs: ordered.  Risk OTC drugs. Prescription drug management.  This patient presents to the ED for concern of abdominal pain, this involves an extensive number of treatment options, and is a complaint that carries with it a high risk of complications and morbidity. Differential diagnosis of epigastric pain includes: Functional or nonulcer dyspepsia, PUD, GERD, Gastritis, (NSAIDs, alcohol, stress, H. pylori, pernicious anemia), pancreatitis or pancreatic cancer,  overeating indigestion (high-fat foods, coffee), drugs (aspirin, antibiotics (eg, macrolides, metronidazole), corticosteroids, digoxin, narcotics, theophylline), gastroparesis, lactose intolerance, malabsorption gastric cancer, parasitic infection, (Giardia, Strongyloides, Ascaris) cholelithiasis, choledocholithiasis, or cholangitis, ACS, pericarditis, pneumonia, abdominal hernia, pregnancy, intestinal ischemia, esophageal rupture, gastric volvulus, hepatitis.   Co morbidities that complicate the patient evaluation   anxiety, depression, hypothyroidism  My initial workup includes abdominal pain labs, symptom control  Additional history obtained from: Nursing notes from this visit.  I ordered, reviewed and interpreted labs which include: CBC, CMP, lipase, urinalysis, urine pregnancy.  Lipase minimally elevated to 53   I  ordered imaging studies including *** I independently visualized and interpreted imaging which showed *** I agree with the radiologist interpretation  Final decision   At this time there does not appear to be any evidence of an acute emergency medical condition and the patient appears stable for discharge with appropriate outpatient follow up. Diagnosis was discussed with patient who verbalizes understanding of care plan and is agreeable to discharge. I have discussed return precautions with patient and *** who verbalizes understanding. Patient encouraged to follow-up with their PCP within ***. All questions answered.  Patient's case discussed with Dr. Marland Kitchen who agrees with plan to discharge with follow-up.   Note: Portions of this report may have been transcribed using voice recognition software. Every effort was made to ensure accuracy; however, inadvertent computerized transcription errors may still be present. ***  {Document critical care time when appropriate:1} {Document review of labs and clinical decision tools ie heart score, Chads2Vasc2 etc:1}  {Document your  independent review of radiology images, and any outside records:1} {Document your discussion with family members, caretakers, and with consultants:1} {Document social determinants of health affecting pt's care:1} {Document your decision making why or why not admission, treatments were needed:1} Final Clinical Impression(s) / ED Diagnoses Final diagnoses:  None    Rx / DC Orders ED Discharge Orders     None

## 2022-09-16 DIAGNOSIS — Z79890 Hormone replacement therapy (postmenopausal): Secondary | ICD-10-CM | POA: Diagnosis not present

## 2022-09-16 DIAGNOSIS — Z803 Family history of malignant neoplasm of breast: Secondary | ICD-10-CM | POA: Diagnosis not present

## 2022-09-16 DIAGNOSIS — F32A Depression, unspecified: Secondary | ICD-10-CM | POA: Diagnosis present

## 2022-09-16 DIAGNOSIS — Z801 Family history of malignant neoplasm of trachea, bronchus and lung: Secondary | ICD-10-CM | POA: Diagnosis not present

## 2022-09-16 DIAGNOSIS — Z809 Family history of malignant neoplasm, unspecified: Secondary | ICD-10-CM | POA: Diagnosis not present

## 2022-09-16 DIAGNOSIS — E039 Hypothyroidism, unspecified: Secondary | ICD-10-CM | POA: Diagnosis present

## 2022-09-16 DIAGNOSIS — Z79899 Other long term (current) drug therapy: Secondary | ICD-10-CM | POA: Diagnosis not present

## 2022-09-16 DIAGNOSIS — R1031 Right lower quadrant pain: Secondary | ICD-10-CM | POA: Diagnosis not present

## 2022-09-16 DIAGNOSIS — K37 Unspecified appendicitis: Secondary | ICD-10-CM | POA: Diagnosis present

## 2022-09-16 DIAGNOSIS — Z6839 Body mass index (BMI) 39.0-39.9, adult: Secondary | ICD-10-CM | POA: Diagnosis not present

## 2022-09-16 DIAGNOSIS — E669 Obesity, unspecified: Secondary | ICD-10-CM | POA: Diagnosis present

## 2022-09-16 LAB — URINALYSIS, ROUTINE W REFLEX MICROSCOPIC
Bilirubin Urine: NEGATIVE
Glucose, UA: NEGATIVE mg/dL
Nitrite: NEGATIVE
Protein, ur: NEGATIVE mg/dL
Specific Gravity, Urine: 1.017 (ref 1.005–1.030)
Squamous Epithelial / HPF: >50 /HPF (ref 0–5)
WBC, UA: >50 WBC/hpf (ref 0–5)
pH: 6 (ref 5.0–8.0)

## 2022-09-16 LAB — CBC
HCT: 36.2 % (ref 36.0–46.0)
Hemoglobin: 12.9 g/dL (ref 12.0–15.0)
MCH: 30.9 pg (ref 26.0–34.0)
MCHC: 35.6 g/dL (ref 30.0–36.0)
MCV: 86.6 fL (ref 80.0–100.0)
Platelets: 292 10*3/uL (ref 150–400)
RBC: 4.18 MIL/uL (ref 3.87–5.11)
RDW: 12.1 % (ref 11.5–15.5)
WBC: 6.8 10*3/uL (ref 4.0–10.5)
nRBC: 0 % (ref 0.0–0.2)

## 2022-09-16 LAB — PREGNANCY, URINE: Preg Test, Ur: NEGATIVE

## 2022-09-16 LAB — LIPASE, BLOOD: Lipase: 37 U/L (ref 11–51)

## 2022-09-16 MED ORDER — ACETAMINOPHEN 325 MG PO TABS
650.0000 mg | ORAL_TABLET | Freq: Four times a day (QID) | ORAL | Status: DC | PRN
  Administered 2022-09-16 – 2022-09-17 (×2): 650 mg via ORAL
  Filled 2022-09-16 (×2): qty 2

## 2022-09-16 MED ORDER — KETOROLAC TROMETHAMINE 15 MG/ML IJ SOLN
15.0000 mg | Freq: Four times a day (QID) | INTRAMUSCULAR | Status: DC | PRN
  Administered 2022-09-16 – 2022-09-18 (×3): 15 mg via INTRAVENOUS
  Filled 2022-09-16 (×3): qty 1

## 2022-09-16 MED ORDER — MORPHINE SULFATE (PF) 4 MG/ML IV SOLN: 4.0000 mg | INTRAVENOUS | Status: DC | PRN

## 2022-09-16 NOTE — ED Provider Notes (Signed)
Patient requesting non narcotic pain medication. She is NPO for possible appendicitis. Toradol not indicated if surgery is a consideration. Offered fentanyl however she's not interested in that either. VSS.    Shavar Gorka, Barbara Cower, MD 09/16/22 [phone]

## 2022-09-16 NOTE — ED Notes (Signed)
Patient request tylenol/motrin for pain. Patient NPO. Dr. Erin Hearing notified. Patient refuses Morphine or the offer of fentanyl for pain.

## 2022-09-16 NOTE — H&P (Signed)
Hannah Douglas is an 37 y.o. female.   Chief Complaint: abd pain HPI: Patient is a 37 year old female, otherwise healthy who has had abdominal pain that began Thursday night.  States this was fairly epigastric in nature.  States that overall then became generalized.  She states that she has had no nausea vomiting diarrhea during that time.  States this was after a high greasy meal.  She states that the pain continued.  She has some decreased appetite.  She was seen in the ER secondary to continued pain.  Upon evaluation the ER stone CT scan.  There was a questionable early acute appendicitis.  Patient with normal white count.  Patient was then transferred for further evaluation to [facility].  Upon evaluation today she states that she has no increase in abdominal pain.  She states she has had no nausea or vomiting.  She is required no pain medication.  Past Medical History:  Diagnosis Date   Anxiety    Depression    Hypothyroidism     Past Surgical History:  Procedure Laterality Date   NO PAST SURGERIES      Family History  Problem Relation Age of Onset   Lung disease Father 49       idiopathic pulmonary fibrosis   Cancer Maternal Grandmother        unknown type   Lung cancer Maternal Grandfather    Breast cancer Paternal Grandmother    Lung cancer Paternal Grandfather    Healthy Mother    Healthy Brother    Healthy Brother    Alcohol abuse Neg Hx    Arthritis Neg Hx    Asthma Neg Hx    Birth defects Neg Hx    COPD Neg Hx    Depression Neg Hx    Diabetes Neg Hx    Drug abuse Neg Hx    Early death Neg Hx    Hearing loss Neg Hx    Heart disease Neg Hx    Hyperlipidemia Neg Hx    Hypertension Neg Hx    Kidney disease Neg Hx    Learning disabilities Neg Hx    Mental illness Neg Hx    Mental retardation Neg Hx    Miscarriages / Stillbirths Neg Hx    Stroke Neg Hx    Vision loss Neg Hx    Varicose Veins Neg Hx    Social History:  reports that she has never  smoked. She has never used smokeless tobacco. She reports current alcohol use. She reports that she does not use drugs.  Allergies: No Known Allergies  Medications Prior to Admission  Medication Sig Dispense Refill   ALPRAZolam (XANAX) 0.5 MG tablet Take 1 tablet (0.5 mg total) by mouth daily as needed. 90 tablet 1   ALPRAZolam (XANAX) 0.5 MG tablet Take 1 tablet (0.5 mg total) by mouth daily as needed. 90 tablet 0   FLUoxetine (PROZAC) 20 MG capsule Take 1 capsule (20 mg total) by mouth daily with 40 mg capsule to equal 60 mg total. 90 capsule 4   FLUoxetine (PROZAC) 20 MG capsule Take 1 capsule (20 mg total) by mouth daily. 90 capsule 4   FLUoxetine (PROZAC) 40 MG capsule Take 1 capsule (40 mg total) by mouth daily. Take with 20 mg capsule to equal 60 mg total daily. 90 capsule 4   FLUoxetine (PROZAC) 40 MG capsule Take 1 capsule (40 mg total) by mouth daily. 90 capsule 4   levothyroxine (SYNTHROID) 100 MCG  tablet Take 1 tablet by mouth 4 days per week. Alternate with levothyroxine . 90 tablet 1   levothyroxine (SYNTHROID) 75 MCG tablet Take 1 tablet (75 mcg total) by mouth 3 (three) times a week. Alternate with levothyroxine . 90 tablet 1   Multiple Vitamin (MULTIVITAMIN) tablet Take 1 tablet by mouth daily.     Omega-3 Fatty Acids (FISH OIL) 1000 MG CAPS Take by mouth 2 (two) times a week.     tretinoin (RETIN-A) 0.05 % cream Apply 1 application to affected areas  topically daily as needed. 45 g 1    Results for orders placed or performed during the hospital encounter of 09/15/22 (from the past 48 hour(s))  Lipase, blood     Status: Abnormal   Collection Time: 09/15/22  6:33 PM  Result Value Ref Range   Lipase 53 (H) 11 - 51 U/L    Comment: Performed at [facility], [address] [place], [place] [address]  Comprehensive metabolic panel     Status: Abnormal   Collection Time: 09/15/22  6:33 PM  Result Value Ref Range   Sodium 135 135 - 145 mmol/L    Potassium 3.9 3.5 - 5.1 mmol/L   Chloride 97 (L) 98 - 111 mmol/L   CO2 26 22 - 32 mmol/L   Glucose, Bld 93 70 - 99 mg/dL    Comment: Glucose reference range applies only to samples taken after fasting for at least 8 hours.   BUN 6 6 - 20 mg/dL   Creatinine, Ser 2.95 0.44 - 1.00 mg/dL   Calcium 9.9 8.9 - 28.4 mg/dL   Total Protein 7.9 6.5 - 8.1 g/dL   Albumin 4.7 3.5 - 5.0 g/dL   AST 21 15 - 41 U/L   ALT 35 0 - 44 U/L   Alkaline Phosphatase 64 38 - 126 U/L   Total Bilirubin 0.8 0.3 - 1.2 mg/dL   GFR, Estimated >13 >24 mL/min    Comment: (NOTE) Calculated using the CKD-EPI Creatinine Equation (2021)    Anion gap 12 5 - 15    Comment: Performed at [facility], [address], [place], [place] [address]  CBC     Status: Abnormal   Collection Time: 09/15/22  6:33 PM  Result Value Ref Range   WBC 9.6 4.0 - 10.5 K/uL   RBC 4.38 3.87 - 5.11 MIL/uL   Hemoglobin 13.7 12.0 - 15.0 g/dL   HCT 72.5 36.6 - 44.0 %   MCV 86.5 80.0 - 100.0 fL   MCH 31.3 26.0 - 34.0 pg   MCHC 36.1 (H) 30.0 - 36.0 g/dL   RDW 34.7 42.5 - 95.6 %   Platelets 312 150 - 400 K/uL   nRBC 0.0 0.0 - 0.2 %    Comment: Performed at [facility], [address], [place], [place] [address]  Urinalysis, Routine w reflex microscopic -Urine, Clean Catch     Status: Abnormal   Collection Time: 09/15/22  6:33 PM  Result Value Ref Range   Color, Urine YELLOW YELLOW   APPearance CLOUDY (A) CLEAR   Specific Gravity, Urine 1.017 1.005 - 1.030   pH 6.0 5.0 - 8.0   Glucose, UA NEGATIVE NEGATIVE mg/dL   Hgb urine dipstick TRACE (A) NEGATIVE   Bilirubin Urine NEGATIVE NEGATIVE   Ketones, ur TRACE (A) NEGATIVE mg/dL   Protein, ur NEGATIVE NEGATIVE mg/dL   Nitrite NEGATIVE NEGATIVE   Leukocytes,Ua LARGE (A) NEGATIVE   RBC / HPF 6-10 0 -  5 RBC/hpf   WBC, UA >50 0 - 5 WBC/hpf   Bacteria, UA FEW (A) NONE SEEN   Squamous Epithelial / HPF >50 0 - 5 /HPF   Mucus PRESENT     Comment: Performed  at [facility], [address], [place], [place] [address]  Pregnancy, urine     Status: None   Collection Time: 09/15/22  6:33 PM  Result Value Ref Range   Preg Test, Ur NEGATIVE NEGATIVE    Comment:        THE SENSITIVITY OF THIS METHODOLOGY IS >25 mIU/mL. Performed at [facility], [address] [place], [place] [address]   CBC     Status: None   Collection Time: 09/16/22 10:04 AM  Result Value Ref Range   WBC 6.8 4.0 - 10.5 K/uL   RBC 4.18 3.87 - 5.11 MIL/uL   Hemoglobin 12.9 12.0 - 15.0 g/dL   HCT 47.8 29.5 - 62.1 %   MCV 86.6 80.0 - 100.0 fL   MCH 30.9 26.0 - 34.0 pg   MCHC 35.6 30.0 - 36.0 g/dL   RDW 30.8 65.7 - 84.6 %   Platelets 292 150 - 400 K/uL   nRBC 0.0 0.0 - 0.2 %    Comment: Performed at [facility], [address], [place], [place] [address]   CT ABDOMEN PELVIS W CONTRAST  Result Date: 09/15/2022 CLINICAL DATA:  Abdominal pain. EXAM: CT ABDOMEN AND PELVIS WITH CONTRAST TECHNIQUE: Multidetector CT imaging of the abdomen and pelvis was performed using the standard protocol following bolus administration of intravenous contrast. RADIATION DOSE REDUCTION: This exam was performed according to the departmental dose-optimization program which includes automated exposure control, adjustment of the mA and/or kV according to patient size and/or use of iterative reconstruction technique. CONTRAST:  OMNIPAQUE IOHEXOL 300 MG/ML  SOLN COMPARISON:  None Available. FINDINGS: Lower chest: Mild linear atelectasis is seen within the right lung base. Hepatobiliary: There is diffuse fatty infiltration of the liver parenchyma. No focal liver abnormality is seen. No gallstones, gallbladder wall thickening, or biliary dilatation. Pancreas: Unremarkable. No pancreatic ductal dilatation or surrounding inflammatory changes. Spleen: Normal in size without focal abnormality. Adrenals/Urinary Tract: Adrenal glands are unremarkable. Kidneys are  normal in size, without renal calculi or hydronephrosis. Bilateral subcentimeter simple renal cysts are seen. Bladder is unremarkable. Stomach/Bowel: Stomach is within normal limits. A trace amount of inflammatory fat stranding is seen along the proximal portion of a 5.5 mm diameter, fluid-filled appendix. No appendiceal wall thickening is noted. No evidence of bowel wall thickening, distention, or inflammatory changes. Vascular/Lymphatic: No significant vascular findings are present. No enlarged abdominal or pelvic lymph nodes. Reproductive: Uterus and bilateral adnexa are unremarkable. Other: No abdominal wall hernia or abnormality. No abdominopelvic ascites. Musculoskeletal: No acute or significant osseous findings. IMPRESSION: 1. Findings which may represent very early changes of acute appendicitis. Correlation with physical examination is recommended. 2. Hepatic steatosis. Electronically Signed   By: Aram Candela M.D.   On: 09/15/2022 23:45    Review of Systems  Constitutional:  Negative for chills and fever.  HENT:  Negative for ear discharge, hearing loss and sore throat.   Eyes:  Negative for discharge.  Respiratory:  Negative for cough and shortness of breath.   Cardiovascular:  Negative for chest pain and leg swelling.  Gastrointestinal:  Positive for abdominal pain. Negative for constipation, diarrhea, nausea and vomiting.  Musculoskeletal:  Negative for myalgias and neck pain.  Skin:  Negative for rash.  Allergic/Immunologic: Negative for environmental allergies.  Neurological:  Negative for dizziness and seizures.  Hematological:  Does not bruise/bleed easily.  Psychiatric/Behavioral:  Negative for suicidal ideas.   All other systems reviewed and are negative.   Blood pressure 124/73, pulse 70, temperature 97.9 F (36.6 C), resp. rate 18, height 5\' 5"  (1.651 m), weight 108.9 kg, last menstrual period 08/12/2022, SpO2 97 %. Physical Exam Constitutional:      Appearance: She is  well-developed.     Comments: Conversant No acute distress  HENT:     Head: Normocephalic and atraumatic.  Eyes:     General: Lids are normal. No scleral icterus.    Pupils: Pupils are equal, round, and reactive to light.     Comments: Pupils are equal round and reactive No lid lag Moist conjunctiva  Neck:     Thyroid: No thyromegaly.     Trachea: No tracheal tenderness.     Comments: No cervical lymphadenopathy Cardiovascular:     Rate and Rhythm: Normal rate and regular rhythm.     Heart sounds: No murmur heard. Pulmonary:     Effort: Pulmonary effort is normal.     Breath sounds: Normal breath sounds. No wheezing or rales.  Abdominal:     Tenderness: There is generalized abdominal tenderness. There is no guarding or rebound.     Hernia: No hernia is present.  Musculoskeletal:     Cervical back: Normal range of motion and neck supple.  Skin:    General: Skin is warm.     Findings: No rash.     Nails: There is no clubbing.     Comments: Normal skin turgor  Neurological:     Mental Status: She is alert and oriented to person, place, and time.     Comments: Normal gait and station  Psychiatric:        Mood and Affect: Mood normal.        Thought Content: Thought content normal.        Judgment: Judgment normal.     Comments: Appropriate affect      Assessment/Plan 37 year old female with questionable acute appendicitis. 1.  At this time would recommend no antibiotics, continue serial abdominal exams, okay for p.o. 2.  If patient's lab studies tomorrow show a leukocytosis or gross abdominal pain we will plan for lap appendectomy.  Axel Filler, MD 09/16/2022, 10:24 AM

## 2022-09-16 NOTE — ED Notes (Addendum)
Report given to Hilton Sinclair, RN

## 2022-09-17 DIAGNOSIS — R1031 Right lower quadrant pain: Secondary | ICD-10-CM | POA: Diagnosis not present

## 2022-09-17 LAB — CBC
HCT: 36.3 % (ref 36.0–46.0)
Hemoglobin: 13 g/dL (ref 12.0–15.0)
MCH: 31.5 pg (ref 26.0–34.0)
MCHC: 35.8 g/dL (ref 30.0–36.0)
MCV: 87.9 fL (ref 80.0–100.0)
Platelets: 284 10*3/uL (ref 150–400)
RBC: 4.13 MIL/uL (ref 3.87–5.11)
RDW: 12.1 % (ref 11.5–15.5)
WBC: 7.9 10*3/uL (ref 4.0–10.5)
nRBC: 0 % (ref 0.0–0.2)

## 2022-09-17 LAB — LIPASE, BLOOD: Lipase: 38 U/L (ref 11–51)

## 2022-09-17 NOTE — Progress Notes (Signed)
Assessment & Plan: HD#2 - abdominal pain, rule out acute appendicitis  AF, WBC normal  Persistent "crampy" abdominal pain in waves  Denies nausea, one diarrheal stool  Will give trial clear liquids this AM        Darnell Level, MD [facility] practice Office: [phone]        Chief Complaint: Abdominal pain  Subjective: Patient in bed, complains of waves of abd pain.  No nausea.  Objective: Vital signs in last 24 hours: Temp:  [97.7 F (36.5 C)-98.4 F (36.9 C)] 98.1 F (36.7 C) (06/23 0804) Pulse Rate:  [62-71] 62 (06/23 0804) Resp:  [18-20] 18 (06/23 0804) BP: (110-124)/(68-80) 118/80 (06/23 0804) SpO2:  [97 %-100 %] 100 % (06/23 0804) Last BM Date : 09/16/22  Intake/Output from previous day: 06/22 0701 - 06/23 0700 In: 240 [P.O.:240] Out: -  Intake/Output this shift: No intake/output data recorded.  Physical Exam: HEENT - sclerae clear, mucous membranes moist Abdomen - soft, no distension; mild diffuse tenderness without guarding; no mass Ext - no edema, non-tender Neuro - alert & oriented, no focal deficits  Lab Results:  Recent Labs    09/16/22 1004 09/17/22 0459  WBC 6.8 7.9  HGB 12.9 13.0  HCT 36.2 36.3  PLT 292 284   BMET Recent Labs    09/15/22 1833  NA 135  K 3.9  CL 97*  CO2 26  GLUCOSE 93  BUN 6  CREATININE 0.69  CALCIUM 9.9   PT/INR No results for input(s): "LABPROT", "INR" in the last 72 hours. Comprehensive Metabolic Panel:    Component Value Date/Time   NA 135 09/15/2022 1833   NA 136 08/29/2022 0727   K 3.9 09/15/2022 1833   K 4.1 08/29/2022 0727   CL 97 (L) 09/15/2022 1833   CL 104 08/29/2022 0727   CO2 26 09/15/2022 1833   CO2 24 08/29/2022 0727   BUN 6 09/15/2022 1833   BUN 12 08/29/2022 0727   CREATININE 0.69 09/15/2022 1833   CREATININE 0.57 08/29/2022 0727   GLUCOSE 93 09/15/2022 1833   GLUCOSE 95 08/29/2022 0727   CALCIUM 9.9 09/15/2022 1833   CALCIUM 9.1 08/29/2022 0727    AST 21 09/15/2022 1833   AST 20 08/29/2022 0727   ALT 35 09/15/2022 1833   ALT 33 08/29/2022 0727   ALKPHOS 64 09/15/2022 1833   ALKPHOS 56 08/29/2022 0727   BILITOT 0.8 09/15/2022 1833   BILITOT 0.3 08/29/2022 0727   PROT 7.9 09/15/2022 1833   PROT 7.2 08/29/2022 0727   ALBUMIN 4.7 09/15/2022 1833   ALBUMIN 4.4 08/29/2022 0727    Studies/Results: CT ABDOMEN PELVIS W CONTRAST  Result Date: 09/15/2022 CLINICAL DATA:  Abdominal pain. EXAM: CT ABDOMEN AND PELVIS WITH CONTRAST TECHNIQUE: Multidetector CT imaging of the abdomen and pelvis was performed using the standard protocol following bolus administration of intravenous contrast. RADIATION DOSE REDUCTION: This exam was performed according to the departmental dose-optimization program which includes automated exposure control, adjustment of the mA and/or kV according to patient size and/or use of iterative reconstruction technique. CONTRAST:  OMNIPAQUE IOHEXOL 300 MG/ML  SOLN COMPARISON:  None Available. FINDINGS: Lower chest: Mild linear atelectasis is seen within the right lung base. Hepatobiliary: There is diffuse fatty infiltration of the liver parenchyma. No focal liver abnormality is seen. No gallstones, gallbladder wall thickening, or biliary dilatation. Pancreas: Unremarkable. No pancreatic ductal dilatation or surrounding inflammatory changes. Spleen: Normal in size without focal abnormality. Adrenals/Urinary Tract: Adrenal  glands are unremarkable. Kidneys are normal in size, without renal calculi or hydronephrosis. Bilateral subcentimeter simple renal cysts are seen. Bladder is unremarkable. Stomach/Bowel: Stomach is within normal limits. A trace amount of inflammatory fat stranding is seen along the proximal portion of a 5.5 mm diameter, fluid-filled appendix. No appendiceal wall thickening is noted. No evidence of bowel wall thickening, distention, or inflammatory changes. Vascular/Lymphatic: No significant vascular findings are  present. No enlarged abdominal or pelvic lymph nodes. Reproductive: Uterus and bilateral adnexa are unremarkable. Other: No abdominal wall hernia or abnormality. No abdominopelvic ascites. Musculoskeletal: No acute or significant osseous findings. IMPRESSION: 1. Findings which may represent very early changes of acute appendicitis. Correlation with physical examination is recommended. 2. Hepatic steatosis. Electronically Signed   By: Aram Candela M.D.   On: 09/15/2022 23:45      Darnell Level 09/17/2022  Patient ID: Hannah Douglas, female   DOB: Jun 19, 1985, 37 y.o.   MRN: [id number]

## 2022-09-18 DIAGNOSIS — R1031 Right lower quadrant pain: Secondary | ICD-10-CM | POA: Diagnosis not present

## 2022-09-18 NOTE — Discharge Summary (Signed)
Patient ID: Hannah Douglas [id number] 09-09-1985 37 y.o.  Admit date: 09/15/2022 Discharge date: 09/18/2022  Admitting Diagnosis: Abdominal pain, rule out appendicitis  Discharge Diagnosis Patient Active Problem List   Diagnosis Date Noted   Appendicitis 09/15/2022   Depression    Hidradenitis suppurativa 08/21/2019   Hypothyroidism 12/03/2013   Obesity, unspecified 12/03/2013    Consultants none  Reason for Admission: Patient is a 37 year old female, otherwise healthy who has had abdominal pain that began Thursday night.  States this was fairly epigastric in nature.  States that overall then became generalized.  She states that she has had no nausea vomiting diarrhea during that time.  States this was after a high greasy meal.  She states that the pain continued.  She has some decreased appetite.  She was seen in the ER secondary to continued pain.  Upon evaluation the ER stone CT scan.  There was a questionable early acute appendicitis.  Patient with normal white count.  Patient was then transferred for further evaluation to [facility].   Upon evaluation today she states that she has no increase in abdominal pain.  She states she has had no nausea or vomiting.  She is required no pain medication  Procedures none  Hospital Course:  The patient was admitted and abx therapy held.  Her WBC remained normal and she remained AF.  She tolerated CLD.  She did not have focal RLQ, N/V/D.  She complained of some LUQ and suprapubic crampy pain, similar to menstrual cramps.  This was controlled with tylenol and Toradol.  The patient did not worsen to where she warranted a dx laparoscopy.  We recommended outpatient PCP follow up if her symptoms persisted for further work up and evaluation.  She was stable on HD 3 for DC home.  Physical Exam: Gen: NAD Lungs: respiratory effort nonlabored Abd: soft, NT to palpable, obese, ND  Allergies as of 09/18/2022   No Known Allergies       Medication List     TAKE these medications    ALPRAZolam 0.5 MG tablet Commonly known as: Xanax Take 1 tablet (0.5 mg total) by mouth daily as needed. What changed:  reasons to take this Another medication with the same name was removed. Continue taking this medication, and follow the directions you see here.   Fish Oil 1000 MG Caps Take 1 capsule by mouth 2 (two) times a week.   FLUoxetine 40 MG capsule Commonly known as: PROzac Take 1 capsule (40 mg total) by mouth daily. What changed: Another medication with the same name was removed. Continue taking this medication, and follow the directions you see here.   levothyroxine 100 MCG tablet Commonly known as: SYNTHROID Take 1 tablet by mouth 4 days per week. Alternate with levothyroxine . What changed:  how much to take how to take this when to take this   levothyroxine 75 MCG tablet Commonly known as: SYNTHROID Take 1 tablet (75 mcg total) by mouth 3 (three) times a week. Alternate with levothyroxine . What changed: Another medication with the same name was changed. Make sure you understand how and when to take each.   multivitamin tablet Take 1 tablet by mouth daily.   tretinoin 0.05 % cream Commonly known as: RETIN-A Apply 1 application to affected areas  topically daily as needed. What changed: when to take this          Follow-up Information     Karie Georges, MD Follow up.  Specialty: Family Medicine Why: As needed Contact information: [address] [place] [address] [phone]                 Signed: Barnetta Chapel, [facility] 09/18/2022, 9:16 AM Please see Amion for pager number during day hours 7:00am-4:30pm, 7-11:30am on Weekends

## 2022-09-18 NOTE — Progress Notes (Signed)
Discharge instructions (including medications) discussed with and copy provided to patient. Patient PIV x1 removed and patient dressed herself. Taken to the discharge lounge to await transportation home.

## 2022-09-18 NOTE — Plan of Care (Signed)

## 2022-09-19 ENCOUNTER — Telehealth: Payer: Self-pay

## 2022-09-19 NOTE — Transitions of Care (Post Inpatient/ED Visit) (Unsigned)
   09/19/2022  Name: Hannah Douglas MRN: [id number] DOB: 07-07-1985  Today's TOC FU Call Status: Today's TOC FU Call Status:: Unsuccessul Call (1st Attempt) Unsuccessful Call (1st Attempt) Date: 09/19/22  Attempted to reach the patient regarding the most recent Inpatient/ED visit.  Follow Up Plan: Additional outreach attempts will be made to reach the patient to complete the Transitions of Care (Post Inpatient/ED visit) call.   Signature   Woodfin Ganja LPN [facility] Nurse Health Advisor Direct Dial [phone]

## 2022-09-20 NOTE — Transitions of Care (Post Inpatient/ED Visit) (Signed)
09/20/2022  Name: Hannah Douglas MRN: [id number] DOB: 1985/04/29  Today's TOC FU Call Status: Today's TOC FU Call Status:: Successful TOC FU Call Competed Unsuccessful Call (1st Attempt) Date: 09/19/22 [facility] FU Call Complete Date: 09/20/22  Transition Care Management Follow-up Telephone Call Date of Discharge: 09/20/22 Discharge Facility: [facility]) Type of Discharge: Inpatient Admission Primary Inpatient Discharge Diagnosis:: Appendicitis How have you been since you were released from the hospital?: Better Any questions or concerns?: Yes Patient Questions/Concerns:: pt never had surgery- no signs of infection- was told to follow up with GYN and or PCP- not sure who she needs to see or what direction she needs to go  Items Reviewed: Did you receive and understand the discharge instructions provided?: Yes Medications obtained,verified, and reconciled?: Yes (Medications Reviewed) Any new allergies since your discharge?: No Dietary orders reviewed?: Yes Do you have support at home?: Yes  Medications Reviewed Today: Medications Reviewed Today     Reviewed by Merleen Nicely, LPN (Licensed Practical Nurse) on 09/20/22 at [phone]  Med List Status: <None>   Medication Order Taking? Sig Documenting Provider Last Dose Status Informant  ALPRAZolam (XANAX) 0.5 MG tablet [id number] Yes Take 1 tablet (0.5 mg total) by mouth daily as needed.  Patient taking differently: Take 0.5 mg by mouth daily as needed for anxiety.    Taking Active Self  FLUoxetine (PROZAC) 40 MG capsule [id number] Yes Take 1 capsule (40 mg total) by mouth daily.  Taking Active Self           Med Note (SATTERFIELD, Genoveva Ill   Sat Sep 16, 2022  5:46 PM) Patient verified she is taking 40 mg tablet   levothyroxine (SYNTHROID) 100 MCG tablet [id number] Yes Take 1 tablet by mouth 4 days per week. Alternate with levothyroxine .  Patient taking differently: Take 100 mcg by mouth daily before breakfast. Take 1 tablet by  mouth 4 days per week. Alternate with levothyroxine .   Karie Georges, MD Taking Active Self           Med Note Julieanne Manson, [facility] [place] [facility]   Wed Sep 20, 2022  9:16 AM) Pt taking once daily    levothyroxine (SYNTHROID) 75 MCG tablet [id number] No Take 1 tablet (75 mcg total) by mouth 3 (three) times a week. Alternate with levothyroxine .  Patient not taking: Reported on 09/16/2022   Karie Georges, MD Not Taking Active Self  Multiple Vitamin (MULTIVITAMIN) tablet [id number] Yes Take 1 tablet by mouth daily. [provider] Taking Active Self  Omega-3 Fatty Acids (FISH OIL) 1000 MG CAPS [id number] Yes Take 1 capsule by mouth 2 (two) times a week. [provider] Taking Active Self  tretinoin (RETIN-A) 0.05 % cream [id number] Yes Apply 1 application to affected areas  topically daily as needed.  Patient taking differently: Apply 1 application  topically at bedtime.    Taking Active Self            Home Care and Equipment/Supplies: Were Home Health Services Ordered?: No Any new equipment or medical supplies ordered?: No  Functional Questionnaire: Do you need assistance with bathing/showering or dressing?: No Do you need assistance with meal preparation?: No Do you need assistance with eating?: No Do you have difficulty maintaining continence: No Do you need assistance with getting out of bed/getting out of a chair/moving?: No Do you have difficulty managing or taking your medications?: No  Follow up appointments reviewed: PCP Follow-up appointment confirmed?: Yes Date of PCP follow-up appointment?:  10/02/22 Follow-up Provider: Dr Casimiro Needle [facility] Follow-up appointment confirmed?: No Do you need transportation to your follow-up appointment?: No Do you understand care options if your condition(s) worsen?: Yes-patient verbalized understanding    SIGNATURE  Woodfin Ganja LPN [facility] Nurse Health Advisor Direct Dial [phone]

## 2022-09-21 ENCOUNTER — Encounter: Payer: Self-pay | Admitting: Family Medicine

## 2022-09-21 ENCOUNTER — Ambulatory Visit (INDEPENDENT_AMBULATORY_CARE_PROVIDER_SITE_OTHER): Payer: 59 | Admitting: Family Medicine

## 2022-09-21 VITALS — BP 112/88 | HR 80 | Temp 99.6°F | Wt 248.6 lb

## 2022-09-21 DIAGNOSIS — R1013 Epigastric pain: Secondary | ICD-10-CM

## 2022-09-21 DIAGNOSIS — E781 Pure hyperglyceridemia: Secondary | ICD-10-CM | POA: Diagnosis not present

## 2022-09-21 DIAGNOSIS — K760 Fatty (change of) liver, not elsewhere classified: Secondary | ICD-10-CM | POA: Diagnosis not present

## 2022-09-21 NOTE — Progress Notes (Signed)
Established Patient Office Visit   Subjective  Patient ID: Hannah Douglas, female    DOB: December 24, 1985  Age: 37 y.o. MRN: [id number]  Chief Complaint  Patient presents with   Pain    Pain in upper middle stomach, sometimes radiates to the sides. Started on 6/20 around 6pm. Went to hospital the next day. Feels like a cramp, comes in in waves.     Patient is a 37 year old female followed by Dr. Casimiro Needle and seen for follow-up.  Patient seen in ED on 09/15/2022 for abdominal pain.  Patient admitted 6/21-6/24/2024.  Pain was epigastric, then became generalized after eating greasy food.  Patient seen in secondary ED but sent to main ED due to concern for early acute appendicitis on CT stone study.  WBC count remains normal.  Since returning home patient endorses continued epigastric pain with radiation to right and left upper quadrants.  Pain is dull and comes in waves.  Brief radiation to back when symptoms initially started last week.  Pain is better with laying on side.  Worse within 30 minutes of eating.  Patient was able to eat small amount of yogurt and a banana today.  Trying to stay hydrated.  Patient denies nausea, vomiting, heartburn symptoms, diarrhea, fever.   Past Medical History:  Diagnosis Date   Anxiety    Depression    Hypothyroidism    Past Surgical History:  Procedure Laterality Date   NO PAST SURGERIES     Social History   Tobacco Use   Smoking status: Never   Smokeless tobacco: Never  Vaping Use   Vaping Use: Never used  Substance Use Topics   Alcohol use: Yes    Comment: occ   Drug use: No   Family History  Problem Relation Age of Onset   Lung disease Father 78       idiopathic pulmonary fibrosis   Cancer Maternal Grandmother        unknown type   Lung cancer Maternal Grandfather    Breast cancer Paternal Grandmother    Lung cancer Paternal Grandfather    Healthy Mother    Healthy Brother    Healthy Brother    Alcohol abuse Neg Hx    Arthritis  Neg Hx    Asthma Neg Hx    Birth defects Neg Hx    COPD Neg Hx    Depression Neg Hx    Diabetes Neg Hx    Drug abuse Neg Hx    Early death Neg Hx    Hearing loss Neg Hx    Heart disease Neg Hx    Hyperlipidemia Neg Hx    Hypertension Neg Hx    Kidney disease Neg Hx    Learning disabilities Neg Hx    Mental illness Neg Hx    Mental retardation Neg Hx    Miscarriages / Stillbirths Neg Hx    Stroke Neg Hx    Vision loss Neg Hx    Varicose Veins Neg Hx    No Known Allergies    ROS Negative unless stated above    Objective:     BP 112/88 (BP Location: Right Arm, Patient Position: Sitting, Cuff Size: Large)   Pulse 80   Temp 99.6 F (37.6 C) (Oral)   Wt 248 lb 9.6 oz (112.8 kg)   LMP 08/12/2022 (Exact Date)   SpO2 98%   BMI 41.37 kg/m    Physical Exam Constitutional:      Appearance: Normal appearance.  HENT:  Head: Normocephalic and atraumatic.     Nose: Nose normal.     Mouth/Throat:     Mouth: Mucous membranes are moist.  Cardiovascular:     Rate and Rhythm: Normal rate and regular rhythm.     Pulses: Normal pulses.     Heart sounds: Normal heart sounds.  Pulmonary:     Effort: Pulmonary effort is normal.     Breath sounds: Normal breath sounds.  Abdominal:     General: Bowel sounds are normal. There is no distension.     Palpations: Abdomen is soft.     Tenderness: There is abdominal tenderness in the right upper quadrant and epigastric area. There is no guarding or rebound. Negative signs include psoas sign and obturator sign.  Musculoskeletal:        General: Normal range of motion.  Skin:    General: Skin is warm and dry.  Neurological:     Mental Status: She is alert and oriented to person, place, and time.      No results found for any visits on 09/21/22.    Assessment & Plan:  Hepatic steatosis -     Comprehensive metabolic panel -     Ambulatory referral to Gastroenterology  Hypertriglyceridemia -     Comprehensive metabolic  panel  Epigastric abdominal pain -     Comprehensive metabolic panel -     Lipase -     CBC with Differential/Platelet -     TSH -     Ambulatory referral to Gastroenterology  Records, notes, imaging, labs from hospitalization reviewed.  No current signs/symptoms of acute appendicitis.  Given location of abdominal pain consider acute pancreatitis, gastric ulcer.  Gallstones less likely as not identified on imaging.  Will obtain labs.  Bland diet.  Tylenol as needed.  Referral to GI.  Given strict precautions.  Return if symptoms worsen or fail to improve.   Deeann Saint, MD

## 2022-09-21 NOTE — Patient Instructions (Addendum)
Referrals placed for you to see gastroenterologist.  You should expect a phone call about scheduling this appointment.  I printed out some information about hepatic steatosis (fat deposit in the liver without inflammation) for you to review.

## 2022-09-22 ENCOUNTER — Encounter: Payer: Self-pay | Admitting: Family Medicine

## 2022-09-22 ENCOUNTER — Encounter: Payer: Self-pay | Admitting: Nurse Practitioner

## 2022-09-22 LAB — COMPREHENSIVE METABOLIC PANEL
ALT: 60 U/L — ABNORMAL HIGH (ref 0–35)
AST: 40 U/L — ABNORMAL HIGH (ref 0–37)
Albumin: 4.6 g/dL (ref 3.5–5.2)
Alkaline Phosphatase: 59 U/L (ref 39–117)
BUN: 9 mg/dL (ref 6–23)
CO2: 25 mEq/L (ref 19–32)
Calcium: 10 mg/dL (ref 8.4–10.5)
Chloride: 100 mEq/L (ref 96–112)
Creatinine, Ser: 0.67 mg/dL (ref 0.40–1.20)
GFR: 110.72 mL/min (ref >60.00)
Glucose, Bld: 75 mg/dL (ref 70–99)
Potassium: 4.3 mEq/L (ref 3.5–5.1)
Sodium: 136 mEq/L (ref 135–145)
Total Bilirubin: 0.4 mg/dL (ref 0.2–1.2)
Total Protein: 7.7 g/dL (ref 6.0–8.3)

## 2022-09-22 LAB — CBC WITH DIFFERENTIAL/PLATELET
Basophils Absolute: 0.1 10*3/uL (ref 0.0–0.1)
Basophils Relative: 0.9 % (ref 0.0–3.0)
Eosinophils Absolute: 0.1 10*3/uL (ref 0.0–0.7)
Eosinophils Relative: 1.2 % (ref 0.0–5.0)
HCT: 39.8 % (ref 36.0–46.0)
Hemoglobin: 13.9 g/dL (ref 12.0–15.0)
Lymphocytes Relative: 39.8 % (ref 12.0–46.0)
Lymphs Abs: 3.4 10*3/uL (ref 0.7–4.0)
MCHC: 34.9 g/dL (ref 30.0–36.0)
MCV: 89.8 fl (ref 78.0–100.0)
Monocytes Absolute: 0.6 10*3/uL (ref 0.1–1.0)
Monocytes Relative: 6.5 % (ref 3.0–12.0)
Neutro Abs: 4.5 10*3/uL (ref 1.4–7.7)
Neutrophils Relative %: 51.6 % (ref 43.0–77.0)
Platelets: 355 10*3/uL (ref 150.0–400.0)
RBC: 4.44 Mil/uL (ref 3.87–5.11)
RDW: 12.8 % (ref 11.5–15.5)
WBC: 8.6 10*3/uL (ref 4.0–10.5)

## 2022-09-22 LAB — TSH: TSH: 5.2 u[IU]/mL (ref 0.35–5.50)

## 2022-09-22 LAB — LIPASE: Lipase: 23 U/L (ref 11.0–59.0)

## 2022-09-29 ENCOUNTER — Other Ambulatory Visit (HOSPITAL_COMMUNITY): Payer: Self-pay

## 2022-10-02 ENCOUNTER — Other Ambulatory Visit (HOSPITAL_COMMUNITY): Payer: Self-pay

## 2022-10-02 ENCOUNTER — Inpatient Hospital Stay: Payer: 59 | Admitting: Family Medicine

## 2022-10-02 NOTE — Telephone Encounter (Signed)
Message sent to PCP as I have not received FMLA paperwork as below.

## 2022-10-02 NOTE — Telephone Encounter (Signed)
Did we receive the FMLA papers from [employer]?

## 2022-10-03 ENCOUNTER — Encounter: Payer: Self-pay | Admitting: Internal Medicine

## 2022-10-03 ENCOUNTER — Ambulatory Visit: Payer: 59 | Admitting: Internal Medicine

## 2022-10-03 ENCOUNTER — Other Ambulatory Visit (HOSPITAL_BASED_OUTPATIENT_CLINIC_OR_DEPARTMENT_OTHER): Payer: Self-pay

## 2022-10-03 VITALS — BP 124/86 | HR 85 | Ht 65.0 in | Wt 250.0 lb

## 2022-10-03 DIAGNOSIS — Z6841 Body Mass Index (BMI) 40.0 and over, adult: Secondary | ICD-10-CM | POA: Diagnosis not present

## 2022-10-03 DIAGNOSIS — R1013 Epigastric pain: Secondary | ICD-10-CM | POA: Diagnosis not present

## 2022-10-03 DIAGNOSIS — K219 Gastro-esophageal reflux disease without esophagitis: Secondary | ICD-10-CM

## 2022-10-03 MED ORDER — PANTOPRAZOLE SODIUM 40 MG PO TBEC
40.0000 mg | DELAYED_RELEASE_TABLET | Freq: Every day | ORAL | 3 refills | Status: AC
  Filled 2022-10-03: qty 90, 90d supply, fill #0
  Filled 2023-02-06: qty 90, 90d supply, fill #1

## 2022-10-03 NOTE — Progress Notes (Signed)
HISTORY OF PRESENT ILLNESS:  Hannah Douglas is a 37 y.o. 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  Patient has no prior history of GI issues or GI evaluations.  Patient tells me that she was in her usual state of health until September 13, 2022 when she developed problems with upper abdominal pain.  This persisted and progressed over the course of 2 days leading to emergency room evaluations.  Initial CT scan raised the question of possible early appendicitis.  Reviewed.  White blood cell count was normal.  She was seen by general surgery.  She was kept in the hospital for 3 days.  No specific treatment.  She was discharged home September 18, 2022.  She was noted to have mildly elevated lipase.  Liver tests were normal.  CT scan did not show gallstones.  She was seen in follow-up by her PCPs office September 21, 2022.  Repeat blood work that day did reveal mildly elevated transaminases with AST 40 and ALT 60.  Normal lipase.  Normal CBC with white blood cell count 8.6.  Lipids from early June revealed triglycerides of 206  She has continued with intermittent short-lived epigastric and sometimes right upper quadrant pain.  At one point there was some radiation to the back.  This typically lasts 5 to 15 minutes.  Described as cramping or wavelike.  She does not use NSAIDs.  She is not on PPI.  She does have intermittent reflux symptoms, particular recently.  No dysphagia.  No significant alcohol use.  She has been modifying her diet because of the pain.  REVIEW OF SYSTEMS:  All non-GI ROS negative unless otherwise stated in the HPI except for fatigue  Past Medical History:  Diagnosis Date   Anxiety    Depression    Hypothyroidism     Past Surgical History:  Procedure Laterality Date   NO PAST SURGERIES      Social History Hannah Douglas  reports that she has never smoked. She has  never used smokeless tobacco. She reports current alcohol use. She reports that she does not use drugs.  family history includes Breast cancer in her paternal grandmother; Cancer in her maternal grandmother; Healthy in her brother, brother, and mother; Lung cancer in her maternal grandfather and paternal grandfather; Lung disease (age of onset: 66) in her father.  No Known Allergies     PHYSICAL EXAMINATION: Vital signs: BP 124/86   Pulse 85   Ht 5\' 5"  (1.651 m)   Wt 250 lb (113.4 kg)   BMI 41.60 kg/m   Constitutional: generally well-appearing, no acute distress Psychiatric: alert and oriented x3, cooperative Eyes: extraocular movements intact, anicteric, conjunctiva pink Mouth: oral pharynx moist, no lesions Neck: supple no lymphadenopathy Cardiovascular: heart regular rate and rhythm, no murmur Lungs: clear to auscultation bilaterally Abdomen: soft, nontender, nondistended, no obvious ascites, no peritoneal signs, normal bowel sounds, no organomegaly Rectal: Omitted Extremities: no clubbing, cyanosis, or lower extremity edema bilaterally Skin: no lesions on visible extremities Neuro: No focal deficits.  Cranial nerves intact  ASSESSMENT:  1.  Recurrent upper abdominal pain.  Initial CT raise question of early appendicitis.  Not felt to be the case with surgery.  Mildly elevated lipase.  Normal pancreas on CT.  Question mild pancreatitis.  Question peptic ulcer disease.  Question gallbladder disease. 2.  GERD.  Recently with increased symptoms 3.  Obesity   PLAN:  1.  Reflux precautions 2.  Prescribe pantoprazole 40 mg daily.  Medication risk reviewed. 3.  Schedule abdominal ultrasound to rule out gallstones 4.  Schedule upper endoscopy to further evaluate upper abdominal pain.The nature of the procedure, as well as the risks, benefits, and alternatives were carefully and thoroughly reviewed with the patient. Ample time for discussion and questions allowed. The patient  understood, was satisfied, and agreed to proceed. 5.  Further recommendations and follow-up to be determined after the above A total time of 60 minutes was spent preparing to see the patient, reviewing the myriad of outside records, reviewing.  Data, obtaining comprehensive history, performing medically appropriate physical exam, counseling and educating the patient regarding above listed issues, ordering advanced radiology study, ordering endoscopic procedure, ordering medication, and documenting clinical information in the health record.

## 2022-10-03 NOTE — Patient Instructions (Signed)
We have sent the following medications to your pharmacy for you to pick up at your convenience:  Pantoprazole.  You will be contacted by [facility] Scheduling in the next 2 days to arrange an abdominal ultrasound.  The number on your caller ID will be [phone], please answer when they call.  If you have not heard from them in 2 days please call [phone] to schedule.    You have been scheduled for an endoscopy. Please follow written instructions given to you at your visit today.  If you use inhalers (even only as needed), please bring them with you on the day of your procedure.  If you take any of the following medications, they will need to be adjusted prior to your procedure:   DO NOT TAKE 7 DAYS PRIOR TO TEST- Trulicity (dulaglutide) Ozempic, Wegovy (semaglutide) Mounjaro (tirzepatide) Bydureon Bcise (exanatide extended release)  DO NOT TAKE 1 DAY PRIOR TO YOUR TEST Rybelsus (semaglutide) Adlyxin (lixisenatide) Victoza (liraglutide) Byetta (exanatide) _________________________________________________________

## 2022-10-04 ENCOUNTER — Other Ambulatory Visit (HOSPITAL_BASED_OUTPATIENT_CLINIC_OR_DEPARTMENT_OTHER): Payer: Self-pay

## 2022-10-05 ENCOUNTER — Encounter: Payer: Self-pay | Admitting: Internal Medicine

## 2022-10-05 ENCOUNTER — Other Ambulatory Visit: Payer: Self-pay | Admitting: Internal Medicine

## 2022-10-05 ENCOUNTER — Ambulatory Visit: Payer: 59 | Admitting: Internal Medicine

## 2022-10-05 ENCOUNTER — Other Ambulatory Visit: Payer: Self-pay

## 2022-10-05 DIAGNOSIS — R1013 Epigastric pain: Secondary | ICD-10-CM | POA: Diagnosis not present

## 2022-10-05 DIAGNOSIS — K219 Gastro-esophageal reflux disease without esophagitis: Secondary | ICD-10-CM

## 2022-10-05 DIAGNOSIS — K317 Polyp of stomach and duodenum: Secondary | ICD-10-CM | POA: Diagnosis not present

## 2022-10-05 MED ORDER — SODIUM CHLORIDE 0.9 % IV SOLN
500.0000 mL | Freq: Once | INTRAVENOUS | Status: DC
Start: 2022-10-05 — End: 2022-10-05

## 2022-10-05 NOTE — Patient Instructions (Signed)
Resume previous diet and medications. Continue previously prescribed Pantoprazole 40 mg daily, every day. Follow up with Dr. Marina Goodell in 6-8 weeks.   YOU HAD AN ENDOSCOPIC PROCEDURE TODAY AT THE [facility]:   Refer to the procedure report that was given to you for any specific questions about what was found during the examination.  If the procedure report does not answer your questions, please call your gastroenterologist to clarify.  If you requested that your care partner not be given the details of your procedure findings, then the procedure report has been included in a sealed envelope for you to review at your convenience later.  YOU SHOULD EXPECT: Some feelings of bloating in the abdomen. Passage of more gas than usual.  Walking can help get rid of the air that was put into your GI tract during the procedure and reduce the bloating. If you had a lower endoscopy (such as a colonoscopy or flexible sigmoidoscopy) you may notice spotting of blood in your stool or on the toilet paper. If you underwent a bowel prep for your procedure, you may not have a normal bowel movement for a few days.  Please Note:  You might notice some irritation and congestion in your nose or some drainage.  This is from the oxygen used during your procedure.  There is no need for concern and it should clear up in a day or so.  SYMPTOMS TO REPORT IMMEDIATELY:  Following upper endoscopy (EGD)  Vomiting of blood or coffee ground material  New chest pain or pain under the shoulder blades  Painful or persistently difficult swallowing  New shortness of breath  Fever of 100F or higher  Black, tarry-looking stools  For urgent or emergent issues, a gastroenterologist can be reached at any hour by calling [phone]. Do not use MyChart messaging for urgent concerns.    DIET:  We do recommend a small meal at first, but then you may proceed to your regular diet.  Drink plenty of fluids but you should avoid  alcoholic beverages for 24 hours.  ACTIVITY:  You should plan to take it easy for the rest of today and you should NOT DRIVE or use heavy machinery until tomorrow (because of the sedation medicines used during the test).    FOLLOW UP: Our staff will call the number listed on your records the next business day following your procedure.  We will call around 7:15- 8:00 am to check on you and address any questions or concerns that you may have regarding the information given to you following your procedure. If we do not reach you, we will leave a message.     If any biopsies were taken you will be contacted by phone or by letter within the next 1-3 weeks.  Please call us at [phone] if you have not heard about the biopsies in 3 weeks.    SIGNATURES/CONFIDENTIALITY: You and/or your care partner have signed paperwork which will be entered into your electronic medical record.  These signatures attest to the fact that that the information above on your After Visit Summary has been reviewed and is understood.  Full responsibility of the confidentiality of this discharge information lies with you and/or your care-partner.

## 2022-10-05 NOTE — Progress Notes (Signed)
HISTORY OF PRESENT ILLNESS:   Hannah Douglas is a 37 y.o. 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  Patient has no prior history of GI issues or GI evaluations.   Patient tells me that she was in her usual state of health until September 13, 2022 when she developed problems with upper abdominal pain.  This persisted and progressed over the course of 2 days leading to emergency room evaluations.  Initial CT scan raised the question of possible early appendicitis.  Reviewed.  White blood cell count was normal.  She was seen by general surgery.  She was kept in the hospital for 3 days.  No specific treatment.  She was discharged home September 18, 2022.  She was noted to have mildly elevated lipase.  Liver tests were normal.  CT scan did not show gallstones.   She was seen in follow-up by her PCPs office September 21, 2022.  Repeat blood work that day did reveal mildly elevated transaminases with AST 40 and ALT 60.  Normal lipase.  Normal CBC with white blood cell count 8.6.  Lipids from early June revealed triglycerides of 206   She has continued with intermittent short-lived epigastric and sometimes right upper quadrant pain.  At one point there was some radiation to the back.  This typically lasts 5 to 15 minutes.  Described as cramping or wavelike.  She does not use NSAIDs.  She is not on PPI.  She does have intermittent reflux symptoms, particular recently.  No dysphagia.  No significant alcohol use.  She has been modifying her diet because of the pain.   REVIEW OF SYSTEMS:   All non-GI ROS negative unless otherwise stated in the HPI except for fatigue       Past Medical History:  Diagnosis Date   Anxiety     Depression     Hypothyroidism                 Past Surgical History:  Procedure Laterality Date   NO PAST SURGERIES              Social History Hannah Douglas   reports that she has never smoked. She has never used smokeless tobacco. She reports current alcohol use. She reports that she does not use drugs.   family history includes Breast cancer in her paternal grandmother; Cancer in her maternal grandmother; Healthy in her brother, brother, and mother; Lung cancer in her maternal grandfather and paternal grandfather; Lung disease (age of onset: 62) in her father.   Allergies  No Known Allergies         PHYSICAL EXAMINATION: Vital signs: BP 124/86   Pulse 85   Ht 5\' 5"  (1.651 m)   Wt 250 lb (113.4 kg)   BMI 41.60 kg/m   Constitutional: generally well-appearing, no acute distress Psychiatric: alert and oriented x3, cooperative Eyes: extraocular movements intact, anicteric, conjunctiva pink Mouth: oral pharynx moist, no lesions Neck: supple no lymphadenopathy Cardiovascular: heart regular rate and rhythm, no murmur Lungs: clear to auscultation bilaterally Abdomen: soft, nontender, nondistended, no obvious ascites, no peritoneal signs, normal bowel sounds, no organomegaly Rectal: Omitted Extremities: no clubbing, cyanosis, or lower extremity edema bilaterally Skin: no lesions on visible extremities Neuro: No focal deficits.  Cranial nerves intact   ASSESSMENT:   1.  Recurrent upper abdominal pain.  Initial CT raise question of  early appendicitis.  Not felt to be the case with surgery.  Mildly elevated lipase.  Normal pancreas on CT.  Question mild pancreatitis.  Question peptic ulcer disease.  Question gallbladder disease. 2.  GERD.  Recently with increased symptoms 3.  Obesity     PLAN:   1.  Reflux precautions 2.  Prescribe pantoprazole 40 mg daily.  Medication risk reviewed. 3.  Schedule abdominal ultrasound to rule out gallstones 4.  Schedule upper endoscopy to further evaluate upper abdominal pain.The nature of the procedure, as well as the risks, benefits, and alternatives were carefully and thoroughly reviewed with the patient.  Ample time for discussion and questions allowed. The patient understood, was satisfied, and agreed to proceed. 5.  Further recommendations and follow-up to be determined after the above

## 2022-10-05 NOTE — Progress Notes (Signed)
VS by DT    

## 2022-10-05 NOTE — Op Note (Signed)
[facility] Patient Name: Hannah Douglas Procedure Date: 10/05/2022 1:45 PM MRN: [id number] Endoscopist: Wilhemina Bonito. Marina Goodell , MD, [id number] Age: 37 Referring MD:  Date of Birth: [birth year] Gender: Female Account #: [id number] Procedure:                Upper GI endoscopy Indications:              Upper abdominal pain, Esophageal reflux Medicines:                Monitored Anesthesia Care Procedure:                Pre-Anesthesia Assessment:                           - Prior to the procedure, a History and Physical                            was performed, and patient medications and                            allergies were reviewed. The patient's tolerance of                            previous anesthesia was also reviewed. The risks                            and benefits of the procedure and the sedation                            options and risks were discussed with the patient.                            All questions were answered, and informed consent                            was obtained. Prior Anticoagulants: The patient has                            taken no anticoagulant or antiplatelet agents. ASA                            Grade Assessment: II - A patient with mild systemic                            disease. After reviewing the risks and benefits,                            the patient was deemed in satisfactory condition to                            undergo the procedure.                           After obtaining informed consent, the endoscope was  passed under direct vision. Throughout the                            procedure, the patient's blood pressure, pulse, and                            oxygen saturations were monitored continuously. The                            Olympus scope [id number] was introduced through the                            mouth, and advanced to the second part of duodenum.                            The upper  GI endoscopy was accomplished without                            difficulty. The patient tolerated the procedure                            well. Scope In: Scope Out: Findings:                 The esophagus was normal.                           The stomach was normal save a few diminutive benign                            fundic gland type polyps and a small hiatal hernia.                           The examined duodenum was normal.                           The cardia and gastric fundus were normal on                            retroflexion. Complications:            No immediate complications. Estimated Blood Loss:     Estimated blood loss: none. Impression:               1. Essentially normal EGD                           2. GERD                           3. Upper abdominal. Recommendation:           - Patient has a contact number available for                            emergencies. The signs and symptoms of potential  delayed complications were discussed with the                            patient. Return to normal activities tomorrow.                            Written discharge instructions were provided to the                            patient.                           - Resume previous diet.                           - Continue present medications.                           - Continue recently prescribed pantoprazole 40 mg                            daily, every day                           - Please make sure the patient is scheduled for                            abdominal ultrasound "abdominal pain". We will be                            in contact with you after the exam has been                            completed and a report available.                           - Office follow-up with Dr. Marina Goodell in 6 to 8 weeks Wilhemina Bonito. Marina Goodell, MD 10/05/2022 2:10:27 PM This report has been signed electronically.

## 2022-10-05 NOTE — Progress Notes (Signed)
Report to PACU, RN, vss, BBS= Clear.  

## 2022-10-06 ENCOUNTER — Telehealth: Payer: Self-pay | Admitting: Family Medicine

## 2022-10-06 ENCOUNTER — Telehealth: Payer: Self-pay | Admitting: *Deleted

## 2022-10-06 DIAGNOSIS — Z0279 Encounter for issue of other medical certificate: Secondary | ICD-10-CM

## 2022-10-06 NOTE — Telephone Encounter (Signed)
No answer on  follow up call. Left message.   

## 2022-10-06 NOTE — Telephone Encounter (Signed)
Patient dropped off document FMLA, to be filled out by provider. Patient requested to send it back via Fax within 5-days. Document is located in providers tray at front office.Please advise at Mobile [phone] (mobile)

## 2022-10-09 ENCOUNTER — Other Ambulatory Visit (HOSPITAL_COMMUNITY): Payer: Self-pay

## 2022-10-11 ENCOUNTER — Telehealth: Payer: Self-pay

## 2022-10-11 DIAGNOSIS — R109 Unspecified abdominal pain: Secondary | ICD-10-CM

## 2022-10-11 NOTE — Telephone Encounter (Signed)
Erroneous encounter

## 2022-10-12 ENCOUNTER — Ambulatory Visit (HOSPITAL_COMMUNITY)
Admission: RE | Admit: 2022-10-12 | Discharge: 2022-10-12 | Disposition: A | Discharge status: Home / Self Care | Payer: 59 | Source: Ambulatory Visit | Attending: Internal Medicine | Admitting: Internal Medicine

## 2022-10-12 ENCOUNTER — Other Ambulatory Visit: Payer: Self-pay

## 2022-10-12 ENCOUNTER — Other Ambulatory Visit (HOSPITAL_BASED_OUTPATIENT_CLINIC_OR_DEPARTMENT_OTHER): Payer: Self-pay

## 2022-10-12 DIAGNOSIS — R109 Unspecified abdominal pain: Secondary | ICD-10-CM | POA: Diagnosis not present

## 2022-10-12 DIAGNOSIS — R1013 Epigastric pain: Secondary | ICD-10-CM | POA: Insufficient documentation

## 2022-10-17 ENCOUNTER — Other Ambulatory Visit: Payer: Self-pay | Admitting: Oncology

## 2022-10-17 DIAGNOSIS — Z006 Encounter for examination for normal comparison and control in clinical research program: Secondary | ICD-10-CM

## 2022-10-18 ENCOUNTER — Other Ambulatory Visit: Payer: Self-pay | Admitting: *Deleted

## 2022-10-23 ENCOUNTER — Other Ambulatory Visit
Admission: RE | Admit: 2022-10-23 | Discharge: 2022-10-23 | Disposition: A | Discharge status: Home / Self Care | Payer: 59 | Source: Ambulatory Visit | Attending: Oncology | Admitting: Oncology

## 2022-10-23 ENCOUNTER — Other Ambulatory Visit (HOSPITAL_BASED_OUTPATIENT_CLINIC_OR_DEPARTMENT_OTHER): Payer: Self-pay

## 2022-10-23 DIAGNOSIS — Z006 Encounter for examination for normal comparison and control in clinical research program: Secondary | ICD-10-CM | POA: Insufficient documentation

## 2022-10-27 ENCOUNTER — Other Ambulatory Visit (INDEPENDENT_AMBULATORY_CARE_PROVIDER_SITE_OTHER): Payer: 59

## 2022-10-27 DIAGNOSIS — E039 Hypothyroidism, unspecified: Secondary | ICD-10-CM

## 2022-10-27 LAB — TSH: TSH: 3.11 u[IU]/mL (ref 0.35–5.50)

## 2022-10-30 ENCOUNTER — Other Ambulatory Visit (HOSPITAL_BASED_OUTPATIENT_CLINIC_OR_DEPARTMENT_OTHER): Payer: Self-pay

## 2022-11-01 ENCOUNTER — Other Ambulatory Visit: Payer: Self-pay

## 2022-11-01 ENCOUNTER — Other Ambulatory Visit (HOSPITAL_BASED_OUTPATIENT_CLINIC_OR_DEPARTMENT_OTHER): Payer: Self-pay

## 2022-11-03 ENCOUNTER — Other Ambulatory Visit: Payer: Self-pay

## 2022-11-14 ENCOUNTER — Other Ambulatory Visit (HOSPITAL_BASED_OUTPATIENT_CLINIC_OR_DEPARTMENT_OTHER): Payer: Self-pay

## 2022-11-29 DIAGNOSIS — F4323 Adjustment disorder with mixed anxiety and depressed mood: Secondary | ICD-10-CM | POA: Diagnosis not present

## 2022-12-06 ENCOUNTER — Ambulatory Visit: Payer: 59 | Admitting: Nurse Practitioner

## 2022-12-25 ENCOUNTER — Ambulatory Visit
Admission: EM | Admit: 2022-12-25 | Discharge: 2022-12-25 | Disposition: A | Discharge status: Home / Self Care | Payer: 59 | Attending: Emergency Medicine | Admitting: Emergency Medicine

## 2022-12-25 ENCOUNTER — Encounter: Payer: Self-pay | Admitting: Emergency Medicine

## 2022-12-25 ENCOUNTER — Other Ambulatory Visit: Payer: Self-pay

## 2022-12-25 DIAGNOSIS — J0100 Acute maxillary sinusitis, unspecified: Secondary | ICD-10-CM | POA: Diagnosis not present

## 2022-12-25 MED ORDER — AMOXICILLIN 875 MG PO TABS
875.0000 mg | ORAL_TABLET | Freq: Two times a day (BID) | ORAL | 0 refills | Status: AC
  Filled 2022-12-25: qty 20, 10d supply, fill #0

## 2022-12-25 NOTE — Discharge Instructions (Signed)
Take the amoxicillin as directed.  Follow up with your primary care provider if your symptoms are not improving.   ° ° °

## 2022-12-25 NOTE — ED Triage Notes (Signed)
Provider triage  

## 2022-12-25 NOTE — ED Provider Notes (Signed)
Hannah Douglas    CSN: [id number] Arrival date & time: 12/25/22  1211      History   Chief Complaint No chief complaint on file.   HPI Hannah Douglas is a 37 y.o. female.  Patient presents with 1.5-week history of sinus pressure, congestion, postnasal drip, runny nose, sore throat, cough.  Treating with OTC cold medication.  No fever, shortness of breath, vomiting, diarrhea, or other symptoms.    The history is provided by the patient and medical records.    Past Medical History:  Diagnosis Date   Anxiety    Depression    Hypothyroidism     Patient Active Problem List   Diagnosis Date Noted   Appendicitis 09/15/2022   Depression    Hidradenitis suppurativa 08/21/2019   Hypothyroidism 12/03/2013   Obesity, unspecified 12/03/2013    Past Surgical History:  Procedure Laterality Date   NO PAST SURGERIES      OB History     Gravida  3   Para  3   Term  2   Preterm  1   AB      Living  3      SAB      IAB      Ectopic      Multiple  0   Live Births  3            Home Medications    Prior to Admission medications   Medication Sig Start Date End Date Taking? Authorizing Provider  amoxicillin (AMOXIL) 875 MG tablet Take 1 tablet (875 mg total) by mouth 2 (two) times daily for 10 days. 12/25/22 01/04/23 Yes Mickie Bail, NP  ALPRAZolam Prudy Feeler) 0.5 MG tablet Take 1 tablet (0.5 mg total) by mouth daily as needed. Patient taking differently: Take 0.5 mg by mouth daily as needed for anxiety. 07/21/21     FLUoxetine (PROZAC) 40 MG capsule Take 1 capsule (40 mg total) by mouth daily. 07/12/22     levothyroxine (SYNTHROID) 100 MCG tablet Take 1 tablet by mouth 4 days per week. Alternate with levothyroxine . Patient taking differently: Take 100 mcg by mouth daily before breakfast. Take 1 tablet by mouth 4 days per week. Alternate with levothyroxine . 08/28/22   Karie Georges, MD  Multiple Vitamin (MULTIVITAMIN) tablet Take 1 tablet  by mouth daily.    [provider]  Omega-3 Fatty Acids (FISH OIL) 1000 MG CAPS Take 1 capsule by mouth 2 (two) times a week.    [provider]  pantoprazole (PROTONIX) 40 MG tablet Take 1 tablet (40 mg total) by mouth daily. 10/03/22   Hilarie Fredrickson, MD  tretinoin (RETIN-A) 0.05 % cream Apply 1 application to affected areas  topically daily as needed. Patient taking differently: Apply 1 application  topically at bedtime. 05/03/22       Family History Family History  Problem Relation Age of Onset   Healthy Mother    Lung disease Father 66       idiopathic pulmonary fibrosis   Healthy Brother    Healthy Brother    Cancer Maternal Grandmother        unknown type   Lung cancer Maternal Grandfather    Breast cancer Paternal Grandmother    Lung cancer Paternal Grandfather    Alcohol abuse Neg Hx    Arthritis Neg Hx    Asthma Neg Hx    Birth defects Neg Hx    COPD Neg Hx  Depression Neg Hx    Diabetes Neg Hx    Drug abuse Neg Hx    Early death Neg Hx    Hearing loss Neg Hx    Heart disease Neg Hx    Hyperlipidemia Neg Hx    Hypertension Neg Hx    Kidney disease Neg Hx    Learning disabilities Neg Hx    Mental illness Neg Hx    Mental retardation Neg Hx    Miscarriages / Stillbirths Neg Hx    Stroke Neg Hx    Vision loss Neg Hx    Varicose Veins Neg Hx    Colon cancer Neg Hx    Esophageal cancer Neg Hx    Rectal cancer Neg Hx    Stomach cancer Neg Hx     Social History Social History   Tobacco Use   Smoking status: Never   Smokeless tobacco: Never  Vaping Use   Vaping status: Never Used  Substance Use Topics   Alcohol use: Yes    Comment: occ   Drug use: No     Allergies   Patient has no known allergies.   Review of Systems Review of Systems  Constitutional:  Negative for chills and fever.  HENT:  Positive for congestion, postnasal drip, rhinorrhea, sinus pressure and sore throat. Negative for ear pain.   Respiratory:  Positive for  cough. Negative for shortness of breath.   Cardiovascular:  Negative for chest pain and palpitations.  Gastrointestinal:  Negative for diarrhea and vomiting.  Skin:  Negative for color change and rash.     Physical Exam Triage Vital Signs ED Triage Vitals  Encounter Vitals Group     BP      Systolic BP Percentile      Diastolic BP Percentile      Pulse      Resp      Temp      Temp src      SpO2      Weight      Height      Head Circumference      Peak Flow      Pain Score      Pain Loc      Pain Education      Exclude from Growth Chart    No data found.  Updated Vital Signs BP 111/79   Pulse 76   Temp 98.4 F (36.9 C)   Resp 18   SpO2 98%   Visual Acuity Right Eye Distance:   Left Eye Distance:   Bilateral Distance:    Right Eye Near:   Left Eye Near:    Bilateral Near:     Physical Exam Vitals and nursing note reviewed.  Constitutional:      General: She is not in acute distress.    Appearance: She is well-developed.  HENT:     Right Ear: Tympanic membrane normal.     Left Ear: Tympanic membrane normal.     Nose: Congestion and rhinorrhea present.     Mouth/Throat:     Mouth: Mucous membranes are moist.     Pharynx: Oropharynx is clear.  Cardiovascular:     Rate and Rhythm: Normal rate and regular rhythm.     Heart sounds: Normal heart sounds.  Pulmonary:     Effort: Pulmonary effort is normal. No respiratory distress.     Breath sounds: Normal breath sounds.  Musculoskeletal:     Cervical back: Neck supple.  Skin:  General: Skin is warm and dry.  Neurological:     Mental Status: She is alert.  Psychiatric:        Mood and Affect: Mood normal.        Behavior: Behavior normal.      UC Treatments / Results  Labs (all labs ordered are listed, but only abnormal results are displayed) Labs Reviewed - No data to display  EKG   Radiology No results found.  Procedures Procedures (including critical care time)  Medications  Ordered in UC Medications - No data to display  Initial Impression / Assessment and Plan / UC Course  I have reviewed the triage vital signs and the nursing notes.  Pertinent labs & imaging results that were available during my care of the patient were reviewed by me and considered in my medical decision making (see chart for details).    Acute sinusitis.  Patient has been symptomatic for more than a week.  She is not improving with OTC treatment.  Treating today with 10-day course of amoxicillin.  Discussed symptomatic care including Tylenol or ibuprofen, Mucinex.  Education provided on sinus infection.  Instructed patient to follow up with her PCP if her symptoms are not improving.  She agrees to plan of care.   Final Clinical Impressions(s) / UC Diagnoses   Final diagnoses:  Acute non-recurrent maxillary sinusitis     Discharge Instructions      Take the amoxicillin as directed.  Follow-up with your primary care provider if your symptoms are not improving.      ED Prescriptions     Medication Sig Dispense Auth. Provider   amoxicillin (AMOXIL) 875 MG tablet Take 1 tablet (875 mg total) by mouth 2 (two) times daily for 10 days. 20 tablet Mickie Bail, NP      PDMP not reviewed this encounter.   Mickie Bail, NP 12/25/22 1311

## 2022-12-26 DIAGNOSIS — F4323 Adjustment disorder with mixed anxiety and depressed mood: Secondary | ICD-10-CM | POA: Diagnosis not present

## 2022-12-28 ENCOUNTER — Other Ambulatory Visit (HOSPITAL_BASED_OUTPATIENT_CLINIC_OR_DEPARTMENT_OTHER): Payer: Self-pay

## 2023-01-01 ENCOUNTER — Other Ambulatory Visit: Payer: Self-pay

## 2023-01-01 ENCOUNTER — Encounter: Payer: Self-pay | Admitting: Family Medicine

## 2023-01-01 ENCOUNTER — Other Ambulatory Visit: Payer: Self-pay | Admitting: Family Medicine

## 2023-01-01 ENCOUNTER — Other Ambulatory Visit (HOSPITAL_COMMUNITY): Payer: Self-pay

## 2023-01-01 ENCOUNTER — Other Ambulatory Visit (HOSPITAL_BASED_OUTPATIENT_CLINIC_OR_DEPARTMENT_OTHER): Payer: Self-pay

## 2023-01-01 DIAGNOSIS — E039 Hypothyroidism, unspecified: Secondary | ICD-10-CM

## 2023-01-02 ENCOUNTER — Other Ambulatory Visit: Payer: Self-pay

## 2023-01-02 ENCOUNTER — Other Ambulatory Visit (HOSPITAL_BASED_OUTPATIENT_CLINIC_OR_DEPARTMENT_OTHER): Payer: Self-pay

## 2023-01-02 MED ORDER — LEVOTHYROXINE SODIUM 100 MCG PO TABS
100.0000 ug | ORAL_TABLET | Freq: Every day | ORAL | 1 refills | Status: DC
Start: 2023-01-02 — End: 2023-06-26
  Filled 2023-01-02: qty 3, 3d supply, fill #0
  Filled 2023-01-02: qty 90, 90d supply, fill #0
  Filled 2023-01-02: qty 87, 87d supply, fill #0
  Filled 2023-03-28: qty 90, 90d supply, fill #1

## 2023-01-12 DIAGNOSIS — F4323 Adjustment disorder with mixed anxiety and depressed mood: Secondary | ICD-10-CM | POA: Diagnosis not present

## 2023-01-25 ENCOUNTER — Ambulatory Visit: Payer: 59 | Admitting: Internal Medicine

## 2023-01-30 DIAGNOSIS — F4323 Adjustment disorder with mixed anxiety and depressed mood: Secondary | ICD-10-CM | POA: Diagnosis not present

## 2023-02-06 ENCOUNTER — Other Ambulatory Visit (HOSPITAL_COMMUNITY): Payer: Self-pay

## 2023-02-06 DIAGNOSIS — F4322 Adjustment disorder with anxiety: Secondary | ICD-10-CM | POA: Diagnosis not present

## 2023-02-06 DIAGNOSIS — F410 Panic disorder [episodic paroxysmal anxiety] without agoraphobia: Secondary | ICD-10-CM | POA: Diagnosis not present

## 2023-02-06 MED ORDER — DESVENLAFAXINE SUCCINATE ER 25 MG PO TB24
25.0000 mg | ORAL_TABLET | Freq: Every morning | ORAL | 0 refills | Status: DC
  Filled 2023-02-06 – 2023-02-07 (×2): qty 90, 90d supply, fill #0

## 2023-02-06 MED ORDER — ALPRAZOLAM 0.5 MG PO TABS
0.5000 mg | ORAL_TABLET | Freq: Every day | ORAL | 0 refills | Status: DC | PRN
  Filled 2023-02-06 – 2023-02-07 (×2): qty 90, 90d supply, fill #0

## 2023-02-07 ENCOUNTER — Other Ambulatory Visit (HOSPITAL_BASED_OUTPATIENT_CLINIC_OR_DEPARTMENT_OTHER): Payer: Self-pay

## 2023-02-07 ENCOUNTER — Other Ambulatory Visit: Payer: Self-pay

## 2023-02-07 ENCOUNTER — Other Ambulatory Visit (HOSPITAL_COMMUNITY): Payer: Self-pay

## 2023-02-08 DIAGNOSIS — R0989 Other specified symptoms and signs involving the circulatory and respiratory systems: Secondary | ICD-10-CM | POA: Diagnosis not present

## 2023-02-08 DIAGNOSIS — R5383 Other fatigue: Secondary | ICD-10-CM | POA: Diagnosis not present

## 2023-02-08 DIAGNOSIS — J209 Acute bronchitis, unspecified: Secondary | ICD-10-CM | POA: Diagnosis not present

## 2023-02-08 DIAGNOSIS — F4323 Adjustment disorder with mixed anxiety and depressed mood: Secondary | ICD-10-CM | POA: Diagnosis not present

## 2023-02-08 DIAGNOSIS — R051 Acute cough: Secondary | ICD-10-CM | POA: Diagnosis not present

## 2023-02-08 DIAGNOSIS — Z8639 Personal history of other endocrine, nutritional and metabolic disease: Secondary | ICD-10-CM | POA: Diagnosis not present

## 2023-02-28 DIAGNOSIS — F4323 Adjustment disorder with mixed anxiety and depressed mood: Secondary | ICD-10-CM | POA: Diagnosis not present

## 2023-03-15 DIAGNOSIS — F4323 Adjustment disorder with mixed anxiety and depressed mood: Secondary | ICD-10-CM | POA: Diagnosis not present

## 2023-03-29 DIAGNOSIS — F410 Panic disorder [episodic paroxysmal anxiety] without agoraphobia: Secondary | ICD-10-CM | POA: Diagnosis not present

## 2023-03-29 DIAGNOSIS — F3342 Major depressive disorder, recurrent, in full remission: Secondary | ICD-10-CM | POA: Diagnosis not present

## 2023-03-29 DIAGNOSIS — F4322 Adjustment disorder with anxiety: Secondary | ICD-10-CM | POA: Diagnosis not present

## 2023-03-30 ENCOUNTER — Other Ambulatory Visit (HOSPITAL_BASED_OUTPATIENT_CLINIC_OR_DEPARTMENT_OTHER): Payer: Self-pay

## 2023-04-12 DIAGNOSIS — F4323 Adjustment disorder with mixed anxiety and depressed mood: Secondary | ICD-10-CM | POA: Diagnosis not present

## 2023-04-23 ENCOUNTER — Other Ambulatory Visit (HOSPITAL_BASED_OUTPATIENT_CLINIC_OR_DEPARTMENT_OTHER): Payer: Self-pay

## 2023-04-23 MED ORDER — DESVENLAFAXINE SUCCINATE ER 50 MG PO TB24
50.0000 mg | ORAL_TABLET | Freq: Every morning | ORAL | 0 refills | Status: DC
  Filled 2023-04-23: qty 25, 25d supply, fill #0
  Filled 2023-04-23: qty 65, 65d supply, fill #0

## 2023-05-10 DIAGNOSIS — F331 Major depressive disorder, recurrent, moderate: Secondary | ICD-10-CM | POA: Diagnosis not present

## 2023-05-24 DIAGNOSIS — F331 Major depressive disorder, recurrent, moderate: Secondary | ICD-10-CM | POA: Diagnosis not present

## 2023-05-29 ENCOUNTER — Telehealth: Admitting: Physician Assistant

## 2023-05-29 ENCOUNTER — Other Ambulatory Visit (HOSPITAL_BASED_OUTPATIENT_CLINIC_OR_DEPARTMENT_OTHER): Payer: Self-pay

## 2023-05-29 DIAGNOSIS — T7840XA Allergy, unspecified, initial encounter: Secondary | ICD-10-CM

## 2023-05-29 MED ORDER — PREDNISONE 10 MG PO TABS
ORAL_TABLET | ORAL | 0 refills | Status: DC
  Filled 2023-05-29: qty 37, 14d supply, fill #0

## 2023-05-29 NOTE — Progress Notes (Signed)
 E Visit for Rash  We are sorry that you are not feeling well. Here is how we plan to help!  Based on what you shared with me you may have an allergic reaction.   I recommend you take Benadryl 25 mg - 50 mg every 4 hours to control the symptoms but if they last over 24 hours it is best that you see an office based provider for follow up.   am prescribing a two week course of steroids (37 tablets of 10 mg prednisone).  Days 1-4 take 4 tablets (40 mg) daily  Days 5-8 take 3 tablets (30 mg) daily, Days 9-11 take 2 tablets (20 mg) daily, Days 12-14 take 1 tablet (10 mg) daily.    HOME CARE:  Take cool showers and avoid direct sunlight. Apply cool compress or wet dressings. Take a bath in an oatmeal bath.  Sprinkle content of one Aveeno packet under running faucet with comfortably warm water.  Bathe for 15-20 minutes, 1-2 times daily.  Pat dry with a towel. Do not rub the rash. Use hydrocortisone cream. Take an antihistamine like Benadryl for widespread rashes that itch.  The adult dose of Benadryl is 25-50 mg by mouth 4 times daily. Caution:  This type of medication may cause sleepiness.  Do not drink alcohol, drive, or operate dangerous machinery while taking antihistamines.  Do not take these medications if you have prostate enlargement.  Read package instructions thoroughly on all medications that you take.  GET HELP RIGHT AWAY IF:  Symptoms don't go away after treatment. Severe itching that persists. If you rash spreads or swells. If you rash begins to smell. If it blisters and opens or develops a yellow-brown crust. You develop a fever. You have a sore throat. You become short of breath.  MAKE SURE YOU:  Understand these instructions. Will watch your condition. Will get help right away if you are not doing well or get worse.  Thank you for choosing an e-visit.  Your e-visit answers were reviewed by a board certified advanced clinical practitioner to complete your personal care  plan. Depending upon the condition, your plan could have included both over the counter or prescription medications.  Please review your pharmacy choice. Make sure the pharmacy is open so you can pick up prescription now. If there is a problem, you may contact your provider through [employer] and have the prescription routed to another pharmacy.  Your safety is important to [place]. If you have drug allergies check your prescription carefully.   For the next 24 hours you can use MyChart to ask questions about today's visit, request a non-urgent call back, or ask for a work or school excuse. You will get an email in the next two days asking about your experience. I hope that your e-visit has been valuable and will speed your recovery.    I have spent 5 minutes in review of e-visit questionnaire, review and updating patient chart, medical decision making and response to patient.   Margaretann Loveless, PA-C

## 2023-06-08 ENCOUNTER — Other Ambulatory Visit: Payer: Self-pay

## 2023-06-08 ENCOUNTER — Other Ambulatory Visit (HOSPITAL_BASED_OUTPATIENT_CLINIC_OR_DEPARTMENT_OTHER): Payer: Self-pay

## 2023-06-11 ENCOUNTER — Other Ambulatory Visit (HOSPITAL_BASED_OUTPATIENT_CLINIC_OR_DEPARTMENT_OTHER): Payer: Self-pay

## 2023-06-11 MED ORDER — TRETINOIN 0.05 % EX CREA
TOPICAL_CREAM | CUTANEOUS | 1 refills | Status: AC
  Filled 2023-06-11: qty 45, 30d supply, fill #0

## 2023-06-12 ENCOUNTER — Other Ambulatory Visit (HOSPITAL_BASED_OUTPATIENT_CLINIC_OR_DEPARTMENT_OTHER): Payer: Self-pay

## 2023-06-21 DIAGNOSIS — F331 Major depressive disorder, recurrent, moderate: Secondary | ICD-10-CM | POA: Diagnosis not present

## 2023-06-26 ENCOUNTER — Other Ambulatory Visit: Payer: Self-pay | Admitting: Family Medicine

## 2023-06-26 ENCOUNTER — Other Ambulatory Visit (HOSPITAL_BASED_OUTPATIENT_CLINIC_OR_DEPARTMENT_OTHER): Payer: Self-pay

## 2023-06-26 DIAGNOSIS — E039 Hypothyroidism, unspecified: Secondary | ICD-10-CM

## 2023-06-26 MED ORDER — LEVOTHYROXINE SODIUM 100 MCG PO TABS
100.0000 ug | ORAL_TABLET | Freq: Every day | ORAL | 1 refills | Status: DC
  Filled 2023-06-26: qty 90, 90d supply, fill #0
  Filled 2023-09-30: qty 90, 90d supply, fill #1

## 2023-07-04 ENCOUNTER — Other Ambulatory Visit (HOSPITAL_BASED_OUTPATIENT_CLINIC_OR_DEPARTMENT_OTHER): Payer: Self-pay

## 2023-07-04 DIAGNOSIS — L700 Acne vulgaris: Secondary | ICD-10-CM | POA: Diagnosis not present

## 2023-07-04 MED ORDER — ADAPALENE 0.3 % EX GEL
CUTANEOUS | 3 refills | Status: AC
  Filled 2023-07-04: qty 45, 30d supply, fill #0

## 2023-07-04 MED ORDER — DOXYCYCLINE HYCLATE 100 MG PO CAPS
ORAL_CAPSULE | ORAL | 2 refills | Status: DC
  Filled 2023-07-04: qty 60, 30d supply, fill #0

## 2023-07-04 MED ORDER — CLINDAMYCIN PHOSPHATE 1 % EX LOTN
TOPICAL_LOTION | CUTANEOUS | 3 refills | Status: DC
  Filled 2023-07-04: qty 60, 30d supply, fill #0

## 2023-07-18 ENCOUNTER — Other Ambulatory Visit (HOSPITAL_BASED_OUTPATIENT_CLINIC_OR_DEPARTMENT_OTHER): Payer: Self-pay

## 2023-07-19 ENCOUNTER — Other Ambulatory Visit (HOSPITAL_BASED_OUTPATIENT_CLINIC_OR_DEPARTMENT_OTHER): Payer: Self-pay

## 2023-07-19 ENCOUNTER — Encounter (HOSPITAL_BASED_OUTPATIENT_CLINIC_OR_DEPARTMENT_OTHER): Payer: Self-pay

## 2023-07-19 DIAGNOSIS — F331 Major depressive disorder, recurrent, moderate: Secondary | ICD-10-CM | POA: Diagnosis not present

## 2023-07-20 ENCOUNTER — Other Ambulatory Visit (HOSPITAL_BASED_OUTPATIENT_CLINIC_OR_DEPARTMENT_OTHER): Payer: Self-pay

## 2023-07-20 MED ORDER — ALPRAZOLAM 0.5 MG PO TABS
0.5000 mg | ORAL_TABLET | Freq: Every day | ORAL | 0 refills | Status: DC | PRN
  Filled 2023-07-20: qty 90, 90d supply, fill #0

## 2023-07-22 ENCOUNTER — Other Ambulatory Visit (HOSPITAL_BASED_OUTPATIENT_CLINIC_OR_DEPARTMENT_OTHER): Payer: Self-pay

## 2023-07-23 ENCOUNTER — Other Ambulatory Visit (HOSPITAL_BASED_OUTPATIENT_CLINIC_OR_DEPARTMENT_OTHER): Payer: Self-pay

## 2023-07-23 MED ORDER — DESVENLAFAXINE SUCCINATE ER 50 MG PO TB24
50.0000 mg | ORAL_TABLET | Freq: Every morning | ORAL | 0 refills | Status: AC
Start: 2023-07-23 — End: ?
  Filled 2023-07-23: qty 90, 90d supply, fill #0

## 2023-08-02 DIAGNOSIS — F331 Major depressive disorder, recurrent, moderate: Secondary | ICD-10-CM | POA: Diagnosis not present

## 2023-08-21 ENCOUNTER — Other Ambulatory Visit (HOSPITAL_BASED_OUTPATIENT_CLINIC_OR_DEPARTMENT_OTHER): Payer: Self-pay

## 2023-08-29 ENCOUNTER — Other Ambulatory Visit (HOSPITAL_BASED_OUTPATIENT_CLINIC_OR_DEPARTMENT_OTHER): Payer: Self-pay

## 2023-08-29 DIAGNOSIS — R232 Flushing: Secondary | ICD-10-CM | POA: Diagnosis not present

## 2023-08-29 DIAGNOSIS — N951 Menopausal and female climacteric states: Secondary | ICD-10-CM | POA: Diagnosis not present

## 2023-08-29 DIAGNOSIS — N9419 Other specified dyspareunia: Secondary | ICD-10-CM | POA: Diagnosis not present

## 2023-08-29 DIAGNOSIS — R6882 Decreased libido: Secondary | ICD-10-CM | POA: Diagnosis not present

## 2023-08-29 DIAGNOSIS — G478 Other sleep disorders: Secondary | ICD-10-CM | POA: Diagnosis not present

## 2023-08-29 MED ORDER — ESTRADIOL 0.0375 MG/24HR TD PTTW
1.0000 | MEDICATED_PATCH | TRANSDERMAL | 1 refills | Status: AC
  Filled 2023-08-29: qty 24, 84d supply, fill #0
  Filled 2023-09-20: qty 8, 28d supply, fill #0
  Filled 2024-01-28: qty 8, 28d supply, fill #1
  Filled 2024-02-25 – 2024-03-07 (×2): qty 8, 28d supply, fill #2

## 2023-08-29 MED ORDER — PROGESTERONE MICRONIZED 100 MG PO CAPS
200.0000 mg | ORAL_CAPSULE | Freq: Every day | ORAL | 1 refills | Status: AC
  Filled 2023-08-29: qty 180, 90d supply, fill #0
  Filled 2023-09-03: qty 60, 30d supply, fill #0
  Filled 2023-09-30: qty 60, 30d supply, fill #1
  Filled 2023-12-06: qty 60, 30d supply, fill #2
  Filled 2024-01-28: qty 60, 30d supply, fill #3
  Filled 2024-02-25 – 2024-03-07 (×2): qty 60, 30d supply, fill #4
  Filled 2024-04-30: qty 60, 30d supply, fill #5

## 2023-08-30 ENCOUNTER — Encounter: Payer: 59 | Admitting: Family Medicine

## 2023-09-03 ENCOUNTER — Other Ambulatory Visit (HOSPITAL_BASED_OUTPATIENT_CLINIC_OR_DEPARTMENT_OTHER): Payer: Self-pay

## 2023-09-03 ENCOUNTER — Encounter (HOSPITAL_BASED_OUTPATIENT_CLINIC_OR_DEPARTMENT_OTHER): Payer: Self-pay

## 2023-09-04 ENCOUNTER — Other Ambulatory Visit (HOSPITAL_BASED_OUTPATIENT_CLINIC_OR_DEPARTMENT_OTHER): Payer: Self-pay

## 2023-09-13 DIAGNOSIS — F331 Major depressive disorder, recurrent, moderate: Secondary | ICD-10-CM | POA: Diagnosis not present

## 2023-09-17 ENCOUNTER — Other Ambulatory Visit (HOSPITAL_COMMUNITY): Payer: Self-pay

## 2023-09-17 DIAGNOSIS — F410 Panic disorder [episodic paroxysmal anxiety] without agoraphobia: Secondary | ICD-10-CM | POA: Diagnosis not present

## 2023-09-17 DIAGNOSIS — F3342 Major depressive disorder, recurrent, in full remission: Secondary | ICD-10-CM | POA: Diagnosis not present

## 2023-09-17 MED ORDER — FLUOXETINE HCL 40 MG PO CAPS
40.0000 mg | ORAL_CAPSULE | Freq: Every day | ORAL | 3 refills | Status: AC
  Filled 2023-09-17 – 2023-09-19 (×2): qty 90, 90d supply, fill #0
  Filled 2023-12-15 – 2023-12-20 (×2): qty 90, 90d supply, fill #1
  Filled 2024-03-09: qty 90, 90d supply, fill #2

## 2023-09-19 ENCOUNTER — Other Ambulatory Visit (HOSPITAL_BASED_OUTPATIENT_CLINIC_OR_DEPARTMENT_OTHER): Payer: Self-pay

## 2023-09-20 ENCOUNTER — Other Ambulatory Visit (HOSPITAL_BASED_OUTPATIENT_CLINIC_OR_DEPARTMENT_OTHER): Payer: Self-pay

## 2023-09-20 ENCOUNTER — Encounter (HOSPITAL_BASED_OUTPATIENT_CLINIC_OR_DEPARTMENT_OTHER): Payer: Self-pay

## 2023-09-20 ENCOUNTER — Other Ambulatory Visit (HOSPITAL_COMMUNITY): Payer: Self-pay

## 2023-10-03 ENCOUNTER — Other Ambulatory Visit (HOSPITAL_BASED_OUTPATIENT_CLINIC_OR_DEPARTMENT_OTHER): Payer: Self-pay

## 2023-10-26 DIAGNOSIS — F331 Major depressive disorder, recurrent, moderate: Secondary | ICD-10-CM | POA: Diagnosis not present

## 2023-11-01 ENCOUNTER — Encounter: Payer: Self-pay | Admitting: Family Medicine

## 2023-11-01 ENCOUNTER — Ambulatory Visit: Admitting: Family Medicine

## 2023-11-01 ENCOUNTER — Other Ambulatory Visit (HOSPITAL_BASED_OUTPATIENT_CLINIC_OR_DEPARTMENT_OTHER): Payer: Self-pay

## 2023-11-01 VITALS — BP 118/84 | HR 87 | Temp 98.0°F | Ht 65.0 in | Wt 252.0 lb

## 2023-11-01 DIAGNOSIS — R1010 Upper abdominal pain, unspecified: Secondary | ICD-10-CM | POA: Diagnosis not present

## 2023-11-01 DIAGNOSIS — R10812 Left upper quadrant abdominal tenderness: Secondary | ICD-10-CM | POA: Diagnosis not present

## 2023-11-01 LAB — CBC WITH DIFFERENTIAL/PLATELET
Basophils Absolute: 0 K/uL (ref 0.0–0.1)
Basophils Relative: 0.2 % (ref 0.0–3.0)
Eosinophils Absolute: 0.2 K/uL (ref 0.0–0.7)
Eosinophils Relative: 2 % (ref 0.0–5.0)
HCT: 38.4 % (ref 36.0–46.0)
Hemoglobin: 13.6 g/dL (ref 12.0–15.0)
Lymphocytes Relative: 25.5 % (ref 12.0–46.0)
Lymphs Abs: 2.3 K/uL (ref 0.7–4.0)
MCHC: 35.3 g/dL (ref 30.0–36.0)
MCV: 88.8 fl (ref 78.0–100.0)
Monocytes Absolute: 0.7 K/uL (ref 0.1–1.0)
Monocytes Relative: 7.9 % (ref 3.0–12.0)
Neutro Abs: 5.9 K/uL (ref 1.4–7.7)
Neutrophils Relative %: 64.4 % (ref 43.0–77.0)
Platelets: 337 K/uL (ref 150.0–400.0)
RBC: 4.32 Mil/uL (ref 3.87–5.11)
RDW: 12.9 % (ref 11.5–15.5)
WBC: 9.1 K/uL (ref 4.0–10.5)

## 2023-11-01 MED ORDER — DICYCLOMINE HCL 20 MG PO TABS
20.0000 mg | ORAL_TABLET | Freq: Three times a day (TID) | ORAL | 0 refills | Status: AC
  Filled 2023-11-01: qty 28, 7d supply, fill #0

## 2023-11-01 NOTE — Progress Notes (Signed)
 Acute Office Visit   Subjective:  Patient ID: Hannah Douglas, female    DOB: 1985-12-29, 38 y.o.   MRN: [id number]  Chief Complaint  Patient presents with   Abdominal Pain    HPI:  Patient is here for an acute visit since her PCP, Dr. Ozell, is not in office. Patient is complaining of mid upper to upper left quad abd pain. Started Sunday night, after dinner. Constant, no change in intensity, dull pain with some sharpness when certain movements. Does not radiate. She has been eating light meals with no relief.   Denies nausea, vomiting, diarrhea, constipation, or fever. Reports she had a bowel movement today that was normal.    She has took Tylenol  Sunday night with no relief. Took Tums daily and Protonix  first time last night. Heat compresses has helped.   Review of Systems  Gastrointestinal:  Positive for abdominal pain.   See HPI above      Objective:   BP 118/84   Pulse 87   Temp 98 F (36.7 C) (Oral)   Ht 5' 5 (1.651 m)   Wt 252 lb (114.3 kg)   SpO2 98%   BMI 41.93 kg/m    Physical Exam Vitals reviewed.  Constitutional:      General: She is not in acute distress.    Appearance: Normal appearance. She is obese. She is not ill-appearing, toxic-appearing or diaphoretic.  HENT:     Head: Normocephalic and atraumatic.  Eyes:     General:        Right eye: No discharge.        Left eye: No discharge.     Conjunctiva/sclera: Conjunctivae normal.  Cardiovascular:     Rate and Rhythm: Normal rate.  Pulmonary:     Effort: Pulmonary effort is normal. No respiratory distress.  Abdominal:     Tenderness: There is abdominal tenderness (Most tenderness mid, left abd side.) in the epigastric area and left upper quadrant.  Musculoskeletal:        General: Normal range of motion.  Skin:    General: Skin is warm and dry.  Neurological:     General: No focal deficit present.     Mental Status: She is alert and oriented to person, place, and time. Mental status is  at baseline.  Psychiatric:        Mood and Affect: Mood normal.        Behavior: Behavior normal.        Thought Content: Thought content normal.        Judgment: Judgment normal.       Assessment & Plan:  Pain of upper abdomen -     CBC with Differential/Platelet -     Comprehensive metabolic panel with GFR -     Lipase -     Dicyclomine  HCl; Take 1 tablet (20 mg total) by mouth 4 (four) times daily -  before meals and at bedtime for 7 days.  Dispense: 28 tablet; Refill: 0 -     CT ABDOMEN PELVIS WO CONTRAST  Left upper quadrant abdominal tenderness, rebound tenderness presence not specified -     CT ABDOMEN PELVIS WO CONTRAST  -Ordered (CBC, CMP, and Lipase) for abd pain. Office will call with results and will be available on MyChart.  -Ordered STAT CT Abd/Pelvis non contrast scan for abd pain. Advised she should receive a phone call either today or tomorrow with scheduling this imaging. If not, please call the office or send  a MyChart message. -Continue to eat a bland, light diet.  -Prescribed Dicyclomine  20mg  tablet before meals and at bedtime to help with cramping.  -If symptoms were to become worse, recommend to be seen at the nearest emergency department.   Karsin Pesta, NP

## 2023-11-01 NOTE — Patient Instructions (Addendum)
-  It was a pleasure to care for you.  -Ordered (CBC, CMP, and Lipase) for abd pain. Office will call with results and will be available on MyChart.  -Ordered STAT CT Abd/Pelvis non contrast scan for abd pain. You should receive a phone call either today or tomorrow with scheduling this imaging. If not, please call the office or send a MyChart message. -Continue to eat a bland, light diet.  -Prescribed Dicyclomine  20mg  tablet before meals and at bedtime to help with cramping.  -If symptoms were to become worse, recommend to be seen at the nearest emergency department.

## 2023-11-02 ENCOUNTER — Ambulatory Visit
Admission: RE | Admit: 2023-11-02 | Discharge: 2023-11-02 | Disposition: A | Discharge status: Home / Self Care | Source: Ambulatory Visit | Attending: Family Medicine | Admitting: Family Medicine

## 2023-11-02 ENCOUNTER — Other Ambulatory Visit: Payer: Self-pay

## 2023-11-02 ENCOUNTER — Encounter: Payer: Self-pay | Admitting: Family Medicine

## 2023-11-02 ENCOUNTER — Ambulatory Visit: Payer: Self-pay | Admitting: Family Medicine

## 2023-11-02 DIAGNOSIS — K859 Acute pancreatitis without necrosis or infection, unspecified: Secondary | ICD-10-CM

## 2023-11-02 DIAGNOSIS — N200 Calculus of kidney: Secondary | ICD-10-CM | POA: Diagnosis not present

## 2023-11-02 DIAGNOSIS — R748 Abnormal levels of other serum enzymes: Secondary | ICD-10-CM

## 2023-11-02 LAB — COMPREHENSIVE METABOLIC PANEL WITH GFR
ALT: 22 U/L (ref 0–35)
AST: 17 U/L (ref 0–37)
Albumin: 4.5 g/dL (ref 3.5–5.2)
Alkaline Phosphatase: 70 U/L (ref 39–117)
BUN: 9 mg/dL (ref 6–23)
CO2: 26 meq/L (ref 19–32)
Calcium: 9.7 mg/dL (ref 8.4–10.5)
Chloride: 99 meq/L (ref 96–112)
Creatinine, Ser: 0.79 mg/dL (ref 0.40–1.20)
GFR: 94.02 mL/min (ref >60.00)
Glucose, Bld: 96 mg/dL (ref 70–99)
Potassium: 4.4 meq/L (ref 3.5–5.1)
Sodium: 137 meq/L (ref 135–145)
Total Bilirubin: 0.5 mg/dL (ref 0.2–1.2)
Total Protein: 7.3 g/dL (ref 6.0–8.3)

## 2023-11-02 LAB — LIPASE: Lipase: 68 U/L — ABNORMAL HIGH (ref 11.0–59.0)

## 2023-11-02 MED ORDER — TRAMADOL HCL 50 MG PO TABS
50.0000 mg | ORAL_TABLET | Freq: Four times a day (QID) | ORAL | 0 refills | Status: AC | PRN
  Filled 2023-11-02 – 2023-11-03 (×2): qty 20, 5d supply, fill #0

## 2023-11-03 ENCOUNTER — Other Ambulatory Visit (HOSPITAL_BASED_OUTPATIENT_CLINIC_OR_DEPARTMENT_OTHER): Payer: Self-pay

## 2023-11-03 MED ORDER — DESVENLAFAXINE SUCCINATE ER 50 MG PO TB24
50.0000 mg | ORAL_TABLET | Freq: Every morning | ORAL | 1 refills | Status: DC
  Filled 2023-11-03: qty 90, 90d supply, fill #0
  Filled 2024-01-29: qty 90, 90d supply, fill #1

## 2023-11-05 ENCOUNTER — Encounter: Payer: Self-pay | Admitting: Internal Medicine

## 2023-11-05 ENCOUNTER — Other Ambulatory Visit: Payer: Self-pay

## 2023-11-08 DIAGNOSIS — F331 Major depressive disorder, recurrent, moderate: Secondary | ICD-10-CM | POA: Diagnosis not present

## 2023-11-13 ENCOUNTER — Encounter (HOSPITAL_COMMUNITY)
Admission: RE | Admit: 2023-11-13 | Discharge: 2023-11-13 | Disposition: A | Discharge status: Home / Self Care | Source: Ambulatory Visit | Attending: Family Medicine | Admitting: Family Medicine

## 2023-11-13 DIAGNOSIS — R748 Abnormal levels of other serum enzymes: Secondary | ICD-10-CM | POA: Diagnosis not present

## 2023-11-13 MED ORDER — TECHNETIUM TC 99M MEBROFENIN IV KIT
5.0000 | PACK | Freq: Once | INTRAVENOUS | Status: AC
  Administered 2023-11-13: 5.4 via INTRAVENOUS

## 2023-11-14 ENCOUNTER — Ambulatory Visit: Payer: Self-pay | Admitting: Family Medicine

## 2023-11-22 DIAGNOSIS — F331 Major depressive disorder, recurrent, moderate: Secondary | ICD-10-CM | POA: Diagnosis not present

## 2023-12-06 ENCOUNTER — Other Ambulatory Visit (HOSPITAL_BASED_OUTPATIENT_CLINIC_OR_DEPARTMENT_OTHER): Payer: Self-pay

## 2023-12-07 ENCOUNTER — Other Ambulatory Visit (HOSPITAL_BASED_OUTPATIENT_CLINIC_OR_DEPARTMENT_OTHER): Payer: Self-pay

## 2023-12-07 ENCOUNTER — Other Ambulatory Visit: Payer: Self-pay

## 2023-12-07 MED ORDER — ALPRAZOLAM 0.5 MG PO TABS
0.5000 mg | ORAL_TABLET | Freq: Every day | ORAL | 0 refills | Status: DC | PRN
  Filled 2023-12-07: qty 90, 90d supply, fill #0

## 2023-12-17 ENCOUNTER — Encounter: Payer: Self-pay | Admitting: Pharmacist

## 2023-12-17 ENCOUNTER — Other Ambulatory Visit: Payer: Self-pay

## 2023-12-20 ENCOUNTER — Other Ambulatory Visit: Payer: Self-pay

## 2023-12-20 DIAGNOSIS — F331 Major depressive disorder, recurrent, moderate: Secondary | ICD-10-CM | POA: Diagnosis not present

## 2023-12-26 ENCOUNTER — Other Ambulatory Visit: Payer: Self-pay | Admitting: Family Medicine

## 2023-12-26 ENCOUNTER — Other Ambulatory Visit (HOSPITAL_BASED_OUTPATIENT_CLINIC_OR_DEPARTMENT_OTHER): Payer: Self-pay

## 2023-12-26 DIAGNOSIS — E039 Hypothyroidism, unspecified: Secondary | ICD-10-CM

## 2023-12-26 MED ORDER — LEVOTHYROXINE SODIUM 100 MCG PO TABS
100.0000 ug | ORAL_TABLET | Freq: Every day | ORAL | 0 refills | Status: DC
  Filled 2023-12-26 – 2024-01-07 (×3): qty 30, 30d supply, fill #0

## 2024-01-04 DIAGNOSIS — F331 Major depressive disorder, recurrent, moderate: Secondary | ICD-10-CM | POA: Diagnosis not present

## 2024-01-07 ENCOUNTER — Other Ambulatory Visit (HOSPITAL_BASED_OUTPATIENT_CLINIC_OR_DEPARTMENT_OTHER): Payer: Self-pay

## 2024-01-09 ENCOUNTER — Ambulatory Visit: Admitting: Internal Medicine

## 2024-01-17 DIAGNOSIS — F331 Major depressive disorder, recurrent, moderate: Secondary | ICD-10-CM | POA: Diagnosis not present

## 2024-01-22 ENCOUNTER — Telehealth: Admitting: Physician Assistant

## 2024-01-22 DIAGNOSIS — J029 Acute pharyngitis, unspecified: Secondary | ICD-10-CM | POA: Diagnosis not present

## 2024-01-22 NOTE — Progress Notes (Signed)
 We are sorry that you are not feeling well.  Here is how we plan to help!  Your symptoms indicate a likely viral infection (Pharyngitis).   Pharyngitis is inflammation in the back of the throat which can cause a sore throat, scratchiness and sometimes difficulty swallowing.   Pharyngitis is typically caused by a respiratory virus and will just run its course.  Please keep in mind that your symptoms could last up to 10 days.    For throat pain, we recommend over the counter oral pain relief medications such as acetaminophen  or aspirin, or anti-inflammatory medications such as ibuprofen  or naproxen sodium.  Topical treatments such as oral throat lozenges or sprays may be used as needed.   Avoid close contact with loved ones, especially the very young and elderly.  Remember to wash your hands thoroughly throughout the day as this is the number one way to prevent the spread of infection! We also recommend that you periodically wipe down door knobs and counters with disinfectant.  After careful review of your answers, I would not recommend and antibiotic for your condition.  Antibiotics should not be used to treat conditions that we suspect are caused by viruses like the virus that causes the common cold or flu.  Providers prescribe antibiotics to treat infections caused by bacteria. Antibiotics are very powerful in treating bacterial infections when they are used properly.  To maintain their effectiveness, they should be used only when necessary.  Overuse of antibiotics has resulted in the development of super bugs that are resistant to treatment!    Some people with strep throat, however, can have atypical symptoms. As such, if anything continued to progress despite treatment recommendations, you may need formal testing in clinic or office.  Home Care: Only take medications as instructed by your medical team. Do not drink alcohol while taking these medications. A steam or ultrasonic humidifier can help  congestion.  You can place a towel over your head and breathe in the steam from hot water coming from a faucet. Avoid close contacts especially the very young and the elderly. Cover your mouth when you cough or sneeze. Always remember to wash your hands.  Get Help Right Away If: You develop worsening fever or throat pain. You develop a severe head ache or visual changes. Your symptoms persist after you have completed your treatment plan.  Make sure you Understand these instructions. Will watch your condition. Will get help right away if you are not doing well or get worse.  Your e-visit answers were reviewed by a board certified advanced clinical practitioner to complete your personal care plan.  Depending on the condition, your plan could have included both over the counter or prescription medications.  If there is a problem please reply once you have received a response from your provider.  Your safety is important to us .  If you have drug allergies check your prescription carefully.    You can use MyChart to ask questions about todays visit, request a non-urgent call back, or ask for a work or school excuse for 24 hours related to this e-Visit. If it has been greater than 24 hours you will need to follow up with your provider, or enter a new e-Visit to address those concerns.  You will get an e-mail in the next two days asking about your experience.  I hope that your e-visit has been valuable and will speed your recovery. Thank you for using e-visits.   I have spent 5 minutes  in review of e-visit questionnaire, review and updating patient chart, medical decision making and response to patient.   Elsie Velma Lunger, PA-C

## 2024-01-23 DIAGNOSIS — J111 Influenza due to unidentified influenza virus with other respiratory manifestations: Secondary | ICD-10-CM | POA: Diagnosis not present

## 2024-01-23 DIAGNOSIS — J029 Acute pharyngitis, unspecified: Secondary | ICD-10-CM | POA: Diagnosis not present

## 2024-01-23 DIAGNOSIS — Z20822 Contact with and (suspected) exposure to covid-19: Secondary | ICD-10-CM | POA: Diagnosis not present

## 2024-01-28 ENCOUNTER — Ambulatory Visit (INDEPENDENT_AMBULATORY_CARE_PROVIDER_SITE_OTHER): Admitting: Family Medicine

## 2024-01-28 ENCOUNTER — Other Ambulatory Visit (HOSPITAL_BASED_OUTPATIENT_CLINIC_OR_DEPARTMENT_OTHER): Payer: Self-pay

## 2024-01-28 ENCOUNTER — Encounter: Payer: Self-pay | Admitting: Family Medicine

## 2024-01-28 VITALS — BP 112/74 | HR 68 | Temp 98.1°F | Ht 64.75 in | Wt 254.2 lb

## 2024-01-28 DIAGNOSIS — Z0000 Encounter for general adult medical examination without abnormal findings: Secondary | ICD-10-CM

## 2024-01-28 DIAGNOSIS — E039 Hypothyroidism, unspecified: Secondary | ICD-10-CM | POA: Diagnosis not present

## 2024-01-28 DIAGNOSIS — Z1231 Encounter for screening mammogram for malignant neoplasm of breast: Secondary | ICD-10-CM | POA: Diagnosis not present

## 2024-01-28 DIAGNOSIS — Z13220 Encounter for screening for lipoid disorders: Secondary | ICD-10-CM

## 2024-01-28 DIAGNOSIS — N926 Irregular menstruation, unspecified: Secondary | ICD-10-CM

## 2024-01-28 DIAGNOSIS — H52201 Unspecified astigmatism, right eye: Secondary | ICD-10-CM | POA: Diagnosis not present

## 2024-01-28 MED ORDER — LEVOTHYROXINE SODIUM 100 MCG PO TABS
100.0000 ug | ORAL_TABLET | Freq: Every day | ORAL | 1 refills | Status: AC
  Filled 2024-01-28 – 2024-01-31 (×2): qty 90, 90d supply, fill #0
  Filled 2024-04-30: qty 90, 90d supply, fill #1

## 2024-01-28 NOTE — Progress Notes (Unsigned)
 Complete physical exam  Patient: Hannah Douglas   DOB: Apr 29, 1985   38 y.o. Female  MRN: [id number]  Subjective:    Chief Complaint  Patient presents with   Annual Exam    Hannah Douglas is a 38 y.o. female who presents today for a complete physical exam. She reports consuming a general diet. Home exercise routine includes walking 3 hrs per week. She generally feels well. She reports sleeping fairly well. States she was having some hot flashes and night sweats. States she did try taking estrogen and progesterone . States she didn't see a big difference so now she is only taking the She does not have additional problems to discuss today.   Is taking nutrafol, just started for hair regrowth.  Most recent fall risk assessment:    11/01/2023    2:27 PM  Fall Risk   Falls in the past year? 0  Number falls in past yr: 0  Injury with Fall? 0  Risk for fall due to : No Fall Risks  Follow up Falls evaluation completed     Most recent depression screenings:    11/01/2023    2:27 PM 08/28/2022    9:08 AM  PHQ 2/9 Scores  PHQ - 2 Score 0 0  PHQ- 9 Score 1 5    Vision:Within last year and Dental: No current dental problems and Receives regular dental care  Patient Active Problem List   Diagnosis Date Noted   Depression     Priority: Medium    Appendicitis 09/15/2022   Hidradenitis suppurativa 08/21/2019   Hypothyroidism 12/03/2013   Obesity, unspecified 12/03/2013      Patient Care Team: Ozell Heron HERO, MD as PCP - General (Family Medicine) Sarrah Browning, MD as Consulting Physician (Obstetrics and Gynecology)   Outpatient Medications Prior to Visit  Medication Sig   Adapalene  0.3 % gel Apply pea sized amount to entire face every other night for 2 weeks, gradually increase to every night if tolerated   ALPRAZolam  (XANAX ) 0.5 MG tablet Take 1 tablet (0.5 mg total) by mouth daily as needed. (Patient taking differently: Take 0.5 mg by mouth daily as needed for anxiety.)    ALPRAZolam  (XANAX ) 0.5 MG tablet Take 1 tablet (0.5 mg total) by mouth daily as needed.   desvenlafaxine  (PRISTIQ ) 50 MG 24 hr tablet Take 1 tablet (50 mg total) by mouth every morning.   desvenlafaxine  (PRISTIQ ) 50 MG 24 hr tablet Take 1 tablet (50 mg total) by mouth in the morning.   dicyclomine  (BENTYL ) 20 MG tablet Take 1 tablet (20 mg total) by mouth 4 (four) times daily -  before meals and at bedtime for 7 days.   estradiol  (VIVELLE -DOT) 0.0375 MG/24HR Place 1 patch onto the skin 2 (two) times a week.   FLUoxetine  (PROZAC ) 40 MG capsule Take 1 capsule (40 mg total) by mouth daily.   FLUoxetine  (PROZAC ) 40 MG capsule Take 1 capsule (40 mg total) by mouth daily.   Multiple Vitamin (MULTIVITAMIN) tablet Take 1 tablet by mouth daily.   Omega-3 Fatty Acids (FISH OIL) 1000 MG CAPS Take 1 capsule by mouth 2 (two) times a week.   pantoprazole  (PROTONIX ) 40 MG tablet Take 1 tablet (40 mg total) by mouth daily.   progesterone  (PROMETRIUM ) 100 MG capsule Take 2 capsules (200 mg total) by mouth at bedtime.   tretinoin  (RETIN-A ) 0.05 % cream Apply 1 application to affected areas  topically daily as needed. (Patient taking differently: Apply 1 application  topically  at bedtime.)   tretinoin  (RETIN-A ) 0.05 % cream Apply to affected area(s) daily as needed   [DISCONTINUED] levothyroxine  (SYNTHROID ) 100 MCG tablet Take 1 tablet (100 mcg total) by mouth daily before breakfast.   No facility-administered medications prior to visit.    Review of Systems  HENT:  Negative for hearing loss.   Eyes:  Negative for blurred vision.  Respiratory:  Negative for shortness of breath.   Cardiovascular:  Negative for chest pain.  Gastrointestinal: Negative.   Genitourinary: Negative.   Musculoskeletal:  Negative for back pain.  Neurological:  Negative for headaches.  Psychiatric/Behavioral:  Negative for depression.        Objective:     BP 112/74   Pulse 68   Temp 98.1 F (36.7 C) (Oral)   Ht 5' 4.75  (1.645 m)   Wt 254 lb 3.2 oz (115.3 kg)   LMP 12/30/2023 (Exact Date)   SpO2 98%   BMI 42.63 kg/m  {Vitals History (Optional):23777}  Physical Exam Vitals reviewed.  Constitutional:      Appearance: Normal appearance. She is well-groomed. She is morbidly obese.  HENT:     Right Ear: Tympanic membrane and ear canal normal.     Left Ear: Tympanic membrane and ear canal normal.     Mouth/Throat:     Mouth: Mucous membranes are moist.     Pharynx: No posterior oropharyngeal erythema.  Eyes:     Conjunctiva/sclera: Conjunctivae normal.  Neck:     Thyroid : No thyromegaly.  Cardiovascular:     Rate and Rhythm: Normal rate and regular rhythm.     Pulses: Normal pulses.     Heart sounds: S1 normal and S2 normal.  Pulmonary:     Effort: Pulmonary effort is normal.     Breath sounds: Normal breath sounds and air entry.  Abdominal:     General: Abdomen is flat. Bowel sounds are normal.     Palpations: Abdomen is soft.  Musculoskeletal:     Right lower leg: No edema.     Left lower leg: No edema.  Lymphadenopathy:     Cervical: No cervical adenopathy.  Neurological:     Mental Status: She is alert and oriented to person, place, and time. Mental status is at baseline.     Gait: Gait is intact.  Psychiatric:        Mood and Affect: Mood and affect normal.        Speech: Speech normal.        Behavior: Behavior normal.        Judgment: Judgment normal.      No results found for any visits on 01/28/24. {Show previous labs (optional):23779}    Assessment & Plan:    Routine Health Maintenance and Physical Exam  Immunization History  Administered Date(s) Administered   Influenza,inj,Quad PF,6+ Mos 11/26/2015   Influenza,trivalent, recombinat, inj, PF 10/26/2011   Influenza-Unspecified 12/25/2013, 11/26/2014, 11/26/2015, 11/25/2016, 01/09/2020, 12/09/2020, 12/28/2021, 01/15/2023, 12/25/2023   Moderna Sars-Covid-2 Vaccination 05/31/2019, 07/02/2019, 02/25/2020   Tdap  08/08/2012, 06/01/2016    Health Maintenance  Topic Date Due   Hepatitis B Vaccines 19-59 Average Risk (1 of 3 - 19+ 3-dose series) Never done   HPV VACCINES (1 - 3-dose SCDM series) Never done   COVID-19 Vaccine (4 - 2025-26 season) 11/26/2023   Cervical Cancer Screening (HPV/Pap Cotest)  08/05/2024   DTaP/Tdap/Td (3 - Td or Tdap) 06/02/2026   Influenza Vaccine  Completed   Hepatitis C Screening  Completed   HIV  Screening  Completed   Pneumococcal Vaccine  Aged Out   Meningococcal B Vaccine  Aged Out    Discussed health benefits of physical activity, and encouraged her to engage in regular exercise appropriate for her age and condition.  Routine general medical examination at a health care facility  Hypothyroidism, unspecified type -     Levothyroxine  Sodium; Take 1 tablet (100 mcg total) by mouth daily before breakfast.  Dispense: 90 tablet; Refill: 1 -     TSH; Future  Lipid screening -     Lipid panel; Future  Breast cancer screening by mammogram -     3D Screening Mammogram, Left and Right; Future  Irregular periods -     Hemoglobin A1c; Future    Return in 1 year (on 01/27/2025).     Heron CHRISTELLA Sharper, MD

## 2024-01-28 NOTE — Patient Instructions (Signed)

## 2024-01-29 LAB — LIPID PANEL
Cholesterol: 196 mg/dL (ref 0–200)
HDL: 37 mg/dL — ABNORMAL LOW (ref >39.00)
LDL Cholesterol: 83 mg/dL (ref 0–99)
NonHDL: 158.98
Total CHOL/HDL Ratio: 5
Triglycerides: 382 mg/dL — ABNORMAL HIGH (ref 0.0–149.0)
VLDL: 76.4 mg/dL — ABNORMAL HIGH (ref 0.0–40.0)

## 2024-01-29 LAB — HEMOGLOBIN A1C: Hgb A1c MFr Bld: 5.5 % (ref 4.6–6.5)

## 2024-01-30 LAB — TSH: TSH: 3.09 u[IU]/mL (ref 0.35–5.50)

## 2024-01-31 ENCOUNTER — Other Ambulatory Visit (HOSPITAL_BASED_OUTPATIENT_CLINIC_OR_DEPARTMENT_OTHER): Payer: Self-pay

## 2024-02-04 ENCOUNTER — Ambulatory Visit: Payer: Self-pay | Admitting: Family Medicine

## 2024-02-13 ENCOUNTER — Other Ambulatory Visit (HOSPITAL_BASED_OUTPATIENT_CLINIC_OR_DEPARTMENT_OTHER): Payer: Self-pay

## 2024-02-13 DIAGNOSIS — L732 Hidradenitis suppurativa: Secondary | ICD-10-CM | POA: Diagnosis not present

## 2024-02-13 DIAGNOSIS — D1801 Hemangioma of skin and subcutaneous tissue: Secondary | ICD-10-CM | POA: Diagnosis not present

## 2024-02-13 DIAGNOSIS — L814 Other melanin hyperpigmentation: Secondary | ICD-10-CM | POA: Diagnosis not present

## 2024-02-13 DIAGNOSIS — L821 Other seborrheic keratosis: Secondary | ICD-10-CM | POA: Diagnosis not present

## 2024-02-13 MED ORDER — SPIRONOLACTONE 50 MG PO TABS
ORAL_TABLET | ORAL | 5 refills | Status: AC
  Filled 2024-02-13: qty 60, 37d supply, fill #0
  Filled 2024-03-09: qty 60, fill #1

## 2024-02-19 ENCOUNTER — Ambulatory Visit
Admission: RE | Admit: 2024-02-19 | Discharge: 2024-02-19 | Disposition: A | Discharge status: Home / Self Care | Source: Ambulatory Visit | Attending: Family Medicine | Admitting: Family Medicine

## 2024-02-19 DIAGNOSIS — Z1231 Encounter for screening mammogram for malignant neoplasm of breast: Secondary | ICD-10-CM | POA: Diagnosis not present

## 2024-02-27 ENCOUNTER — Other Ambulatory Visit (HOSPITAL_COMMUNITY): Payer: Self-pay

## 2024-02-28 DIAGNOSIS — F331 Major depressive disorder, recurrent, moderate: Secondary | ICD-10-CM | POA: Diagnosis not present

## 2024-03-06 ENCOUNTER — Other Ambulatory Visit (HOSPITAL_BASED_OUTPATIENT_CLINIC_OR_DEPARTMENT_OTHER): Payer: Self-pay

## 2024-03-07 ENCOUNTER — Other Ambulatory Visit (HOSPITAL_BASED_OUTPATIENT_CLINIC_OR_DEPARTMENT_OTHER): Payer: Self-pay

## 2024-03-09 ENCOUNTER — Other Ambulatory Visit (HOSPITAL_BASED_OUTPATIENT_CLINIC_OR_DEPARTMENT_OTHER): Payer: Self-pay

## 2024-03-10 ENCOUNTER — Other Ambulatory Visit: Payer: Self-pay

## 2024-03-10 DIAGNOSIS — F900 Attention-deficit hyperactivity disorder, predominantly inattentive type: Secondary | ICD-10-CM | POA: Diagnosis not present

## 2024-03-10 DIAGNOSIS — F3342 Major depressive disorder, recurrent, in full remission: Secondary | ICD-10-CM | POA: Diagnosis not present

## 2024-03-10 DIAGNOSIS — F410 Panic disorder [episodic paroxysmal anxiety] without agoraphobia: Secondary | ICD-10-CM | POA: Diagnosis not present

## 2024-03-11 ENCOUNTER — Other Ambulatory Visit (HOSPITAL_BASED_OUTPATIENT_CLINIC_OR_DEPARTMENT_OTHER): Payer: Self-pay

## 2024-03-11 MED ORDER — GUANFACINE HCL ER 1 MG PO TB24
1.0000 mg | ORAL_TABLET | Freq: Every morning | ORAL | 0 refills | Status: AC
  Filled 2024-03-11: qty 90, 90d supply, fill #0

## 2024-03-13 DIAGNOSIS — F331 Major depressive disorder, recurrent, moderate: Secondary | ICD-10-CM | POA: Diagnosis not present

## 2024-04-30 ENCOUNTER — Other Ambulatory Visit (HOSPITAL_BASED_OUTPATIENT_CLINIC_OR_DEPARTMENT_OTHER): Payer: Self-pay

## 2024-05-01 ENCOUNTER — Other Ambulatory Visit (HOSPITAL_BASED_OUTPATIENT_CLINIC_OR_DEPARTMENT_OTHER): Payer: Self-pay

## 2024-05-01 ENCOUNTER — Other Ambulatory Visit: Payer: Self-pay

## 2024-05-01 MED ORDER — ALPRAZOLAM 0.5 MG PO TABS
0.5000 mg | ORAL_TABLET | Freq: Every day | ORAL | 0 refills | Status: AC | PRN
  Filled 2024-05-01: qty 90, 90d supply, fill #0

## 2024-05-01 MED ORDER — DESVENLAFAXINE SUCCINATE ER 50 MG PO TB24
50.0000 mg | ORAL_TABLET | Freq: Every morning | ORAL | 1 refills | Status: AC
  Filled 2024-05-01: qty 90, 90d supply, fill #0
